# Patient Record
Sex: Male | Born: 1935 | Race: White | Hispanic: No | Marital: Married | Smoking: Never smoker
Health system: Southern US, Community
[De-identification: ages and names within clinical notes are randomized; demographics above are authoritative.]

## PROBLEM LIST (undated history)

## (undated) DIAGNOSIS — F0390 Unspecified dementia without behavioral disturbance: Secondary | ICD-10-CM

## (undated) DIAGNOSIS — E119 Type 2 diabetes mellitus without complications: Secondary | ICD-10-CM

## (undated) HISTORY — DX: Type 2 diabetes mellitus without complications: E11.9

## (undated) HISTORY — PX: CATARACT EXTRACTION, BILATERAL: SHX1313

---

## 1963-07-13 HISTORY — PX: TONSILLECTOMY AND ADENOIDECTOMY: SUR1326

## 2000-04-07 ENCOUNTER — Ambulatory Visit (HOSPITAL_COMMUNITY): Admission: RE | Admit: 2000-04-07 | Discharge: 2000-04-07 | Payer: Self-pay | Admitting: Gastroenterology

## 2000-04-20 ENCOUNTER — Encounter: Payer: Self-pay | Admitting: Gastroenterology

## 2000-04-20 ENCOUNTER — Encounter: Admission: RE | Admit: 2000-04-20 | Discharge: 2000-04-20 | Payer: Self-pay | Admitting: Gastroenterology

## 2001-07-31 LAB — HM COLONOSCOPY

## 2011-11-10 LAB — HM DIABETES EYE EXAM

## 2012-04-30 LAB — HM DIABETES FOOT EXAM

## 2012-05-15 ENCOUNTER — Ambulatory Visit (INDEPENDENT_AMBULATORY_CARE_PROVIDER_SITE_OTHER): Payer: Medicare Other | Admitting: Family Medicine

## 2012-05-15 ENCOUNTER — Encounter: Payer: Self-pay | Admitting: Family Medicine

## 2012-05-15 VITALS — BP 120/66 | HR 92 | Temp 98.4°F | Ht 71.0 in | Wt 172.0 lb

## 2012-05-15 DIAGNOSIS — Z7689 Persons encountering health services in other specified circumstances: Secondary | ICD-10-CM

## 2012-05-15 DIAGNOSIS — E119 Type 2 diabetes mellitus without complications: Secondary | ICD-10-CM

## 2012-05-15 DIAGNOSIS — Z7189 Other specified counseling: Secondary | ICD-10-CM

## 2012-05-15 NOTE — Patient Instructions (Signed)
-  We have ordered labs or studies at this visit. It can take up to 1-2 weeks for results and processing. We will contact you with instructions IF your results are abnormal. Normal results will be released to your MYCHART. If you have not heard from us or can not find your results in MYCHART in 2 weeks please contact our office.  -PLEASE SIGN UP FOR MYCHART TODAY   We recommend the following healthy lifestyle measures: - eat a healthy diet consisting of lots of vegetables, fruits, beans, nuts, seeds, healthy meats such as white chicken and fish and whole grains.  - avoid fried foods, fast food, processed foods, sodas, red meet and other fattening foods.  - get a least 150 minutes of aerobic exercise per week.   Follow up in: 3-4 months  

## 2012-05-15 NOTE — Progress Notes (Signed)
Chief Complaint  Patient presents with  . Establish Care    HPI: Stanley Gilbert is here to establish care. Used to see Dr. Clarene Duke, but insurance changed and can not longer see that doctor.  Has the following concerns today:  DM: -taking metformin 1000mg  bid and amaryl 8mg  bid - reports has been taking this for at least 10 years and feels fine with this dose and doesn't want to change this, renal function has always been fine -foot exam 6 months ago -regular eye exams - last one about 2 weeks -walks 2 miles per day and eats healthly  Had labs checked on 04/23/2012 -HgbA1c 7.2, fasting BS ave 101, high 145, low 79 -LDL 93, HDL 30 -microalb 3.3 -CMP and CBC  Normal about 1 year ago  Had flu vaccine 04/23/12, pneumovax 3 years, refuses shingles vaccine  ROS: See pertinent positives and negatives per HPI.  Past Medical History  Diagnosis Date  . Diabetes     Family History  Problem Relation Age of Onset  . Arthritis      parent  . Lung cancer      parent  . Heart disease      parent  . Diabetes      parent    History   Social History  . Marital Status: Married    Spouse Name: N/A    Number of Children: N/A  . Years of Education: N/A   Social History Main Topics  . Smoking status: Never Smoker   . Smokeless tobacco: None  . Alcohol Use: No  . Drug Use: None  . Sexually Active: None   Other Topics Concern  . None   Social History Narrative  . None    Current outpatient prescriptions:glimepiride (AMARYL) 4 MG tablet, Take 2 tablets by mouth twice a day., Disp: , Rfl: ;  glucose blood (ONE TOUCH ULTRA TEST) test strip, 1 each by Other route as needed. Use as instructed, Disp: , Rfl: ;  Lancets (ONETOUCH ULTRASOFT) lancets, 1 each by Other route as needed. Use as instructed, Disp: , Rfl: ;  metFORMIN (GLUCOPHAGE) 500 MG tablet, Take 1,000 mg by mouth 2 (two) times daily with a meal. , Disp: , Rfl:  Multiple Vitamin (MULTIVITAMIN) tablet, Take 1 tablet by mouth  daily., Disp: , Rfl:   EXAM:  Filed Vitals:   05/15/12 1255  BP: 120/66  Pulse: 92  Temp: 98.4 F (36.9 C)    Body mass index is 23.99 kg/(m^2).  GENERAL: vitals reviewed and listed above, alert, oriented, appears well hydrated and in no acute distress  HEENT: atraumatic, conjunttiva clear, no obvious abnormalities on inspection of external nose and ears  NECK: no obvious masses on inspection  LUNGS: clear to auscultation bilaterally, no wheezes, rales or rhonchi, good air movement  CV: HRRR, no peripheral edema  MS: moves all extremities without noticeable abnormality  PSYCH: pleasant and cooperative, no obvious depression or anxiety  ASSESSMENT AND PLAN:  Discussed the following assessment and plan:  1. Diabetes  -exercise is great, diet has room for improvement - disucssed -home blood sugars are great -on greater then max dose of amaryl - uncertain if this is needed -pt will decrease to 4 mg bid of amaryl, work on diet, follow up in 3 months with home log - will return sooner if increase in home BSs  2. Encounter to establish care with new doctor    -We reviewed the PMH, PSH, FH, SH, Meds and Allergies. -We  provided refills for any medications we will prescribe as needed. -We addressed current concerns per orders and patient instructions. -We have asked for records for pertinent exams, studies, vaccines and notes from previous providers. -We have advised patient to follow up per instructions below. -Influenza vaccine given today  -Patient advised to return or notify a doctor immediately if symptoms worsen or persist or new concerns arise.  Patient Instructions  -We have ordered labs or studies at this visit. It can take up to 1-2 weeks for results and processing. We will contact you with instructions IF your results are abnormal. Normal results will be released to your [facility]. If you have not heard from [place] or can not find your results in [facility] in 2 weeks please  contact our office.  -PLEASE SIGN UP FOR MYCHART TODAY   We recommend the following healthy lifestyle measures: - eat a healthy diet consisting of lots of vegetables, fruits, beans, nuts, seeds, healthy meats such as white chicken and fish and whole grains.  - avoid fried foods, fast food, processed foods, sodas, red meet and other fattening foods.  - get a least 150 minutes of aerobic exercise per week.   Follow up in: 3-4 months      Brenten Janney R.

## 2012-09-12 ENCOUNTER — Ambulatory Visit: Payer: Medicare Other | Admitting: Family Medicine

## 2012-10-10 ENCOUNTER — Ambulatory Visit: Payer: Medicare Other | Admitting: Family Medicine

## 2012-10-23 DIAGNOSIS — L409 Psoriasis, unspecified: Secondary | ICD-10-CM | POA: Insufficient documentation

## 2013-07-27 DIAGNOSIS — E11649 Type 2 diabetes mellitus with hypoglycemia without coma: Secondary | ICD-10-CM | POA: Insufficient documentation

## 2013-07-27 DIAGNOSIS — E119 Type 2 diabetes mellitus without complications: Secondary | ICD-10-CM | POA: Insufficient documentation

## 2015-08-20 DIAGNOSIS — L570 Actinic keratosis: Secondary | ICD-10-CM | POA: Diagnosis not present

## 2015-08-20 DIAGNOSIS — R351 Nocturia: Secondary | ICD-10-CM | POA: Diagnosis not present

## 2015-08-20 DIAGNOSIS — E782 Mixed hyperlipidemia: Secondary | ICD-10-CM | POA: Diagnosis not present

## 2015-08-20 DIAGNOSIS — Z125 Encounter for screening for malignant neoplasm of prostate: Secondary | ICD-10-CM | POA: Diagnosis not present

## 2015-08-20 DIAGNOSIS — E139 Other specified diabetes mellitus without complications: Secondary | ICD-10-CM | POA: Diagnosis not present

## 2015-11-07 DIAGNOSIS — E119 Type 2 diabetes mellitus without complications: Secondary | ICD-10-CM | POA: Diagnosis not present

## 2015-12-04 DIAGNOSIS — E119 Type 2 diabetes mellitus without complications: Secondary | ICD-10-CM | POA: Diagnosis not present

## 2015-12-09 DIAGNOSIS — H53001 Unspecified amblyopia, right eye: Secondary | ICD-10-CM | POA: Diagnosis not present

## 2015-12-09 DIAGNOSIS — E119 Type 2 diabetes mellitus without complications: Secondary | ICD-10-CM | POA: Diagnosis not present

## 2015-12-09 DIAGNOSIS — H26493 Other secondary cataract, bilateral: Secondary | ICD-10-CM | POA: Diagnosis not present

## 2015-12-09 DIAGNOSIS — H524 Presbyopia: Secondary | ICD-10-CM | POA: Diagnosis not present

## 2015-12-11 DIAGNOSIS — E119 Type 2 diabetes mellitus without complications: Secondary | ICD-10-CM | POA: Diagnosis not present

## 2016-04-05 DIAGNOSIS — Z23 Encounter for immunization: Secondary | ICD-10-CM | POA: Diagnosis not present

## 2016-04-05 DIAGNOSIS — L570 Actinic keratosis: Secondary | ICD-10-CM | POA: Diagnosis not present

## 2016-04-05 DIAGNOSIS — E139 Other specified diabetes mellitus without complications: Secondary | ICD-10-CM | POA: Diagnosis not present

## 2016-04-06 DIAGNOSIS — E139 Other specified diabetes mellitus without complications: Secondary | ICD-10-CM | POA: Diagnosis not present

## 2016-06-12 DIAGNOSIS — E119 Type 2 diabetes mellitus without complications: Secondary | ICD-10-CM | POA: Diagnosis not present

## 2016-07-30 DIAGNOSIS — M79641 Pain in right hand: Secondary | ICD-10-CM | POA: Diagnosis not present

## 2016-07-30 DIAGNOSIS — W19XXXA Unspecified fall, initial encounter: Secondary | ICD-10-CM | POA: Diagnosis not present

## 2016-07-30 DIAGNOSIS — R109 Unspecified abdominal pain: Secondary | ICD-10-CM | POA: Diagnosis not present

## 2016-08-03 DIAGNOSIS — H524 Presbyopia: Secondary | ICD-10-CM | POA: Diagnosis not present

## 2016-08-03 DIAGNOSIS — H5212 Myopia, left eye: Secondary | ICD-10-CM | POA: Diagnosis not present

## 2016-08-03 DIAGNOSIS — H5201 Hypermetropia, right eye: Secondary | ICD-10-CM | POA: Diagnosis not present

## 2016-08-03 DIAGNOSIS — H26493 Other secondary cataract, bilateral: Secondary | ICD-10-CM | POA: Diagnosis not present

## 2016-08-03 DIAGNOSIS — E119 Type 2 diabetes mellitus without complications: Secondary | ICD-10-CM | POA: Diagnosis not present

## 2016-08-03 DIAGNOSIS — H52221 Regular astigmatism, right eye: Secondary | ICD-10-CM | POA: Diagnosis not present

## 2016-09-05 DIAGNOSIS — E119 Type 2 diabetes mellitus without complications: Secondary | ICD-10-CM | POA: Diagnosis not present

## 2016-10-04 DIAGNOSIS — J309 Allergic rhinitis, unspecified: Secondary | ICD-10-CM | POA: Diagnosis not present

## 2016-10-04 DIAGNOSIS — E139 Other specified diabetes mellitus without complications: Secondary | ICD-10-CM | POA: Diagnosis not present

## 2016-10-04 DIAGNOSIS — L570 Actinic keratosis: Secondary | ICD-10-CM | POA: Diagnosis not present

## 2016-10-04 DIAGNOSIS — R42 Dizziness and giddiness: Secondary | ICD-10-CM | POA: Diagnosis not present

## 2016-12-03 DIAGNOSIS — E119 Type 2 diabetes mellitus without complications: Secondary | ICD-10-CM | POA: Diagnosis not present

## 2017-01-25 DIAGNOSIS — H01005 Unspecified blepharitis left lower eyelid: Secondary | ICD-10-CM | POA: Diagnosis not present

## 2017-01-25 DIAGNOSIS — H01001 Unspecified blepharitis right upper eyelid: Secondary | ICD-10-CM | POA: Diagnosis not present

## 2017-01-25 DIAGNOSIS — H01002 Unspecified blepharitis right lower eyelid: Secondary | ICD-10-CM | POA: Diagnosis not present

## 2017-01-25 DIAGNOSIS — H01004 Unspecified blepharitis left upper eyelid: Secondary | ICD-10-CM | POA: Diagnosis not present

## 2017-02-08 DIAGNOSIS — H01005 Unspecified blepharitis left lower eyelid: Secondary | ICD-10-CM | POA: Diagnosis not present

## 2017-02-08 DIAGNOSIS — H01001 Unspecified blepharitis right upper eyelid: Secondary | ICD-10-CM | POA: Diagnosis not present

## 2017-02-08 DIAGNOSIS — H01004 Unspecified blepharitis left upper eyelid: Secondary | ICD-10-CM | POA: Diagnosis not present

## 2017-02-08 DIAGNOSIS — H01002 Unspecified blepharitis right lower eyelid: Secondary | ICD-10-CM | POA: Diagnosis not present

## 2017-03-07 DIAGNOSIS — E1165 Type 2 diabetes mellitus with hyperglycemia: Secondary | ICD-10-CM | POA: Diagnosis not present

## 2017-03-08 DIAGNOSIS — E119 Type 2 diabetes mellitus without complications: Secondary | ICD-10-CM | POA: Diagnosis not present

## 2017-04-12 DIAGNOSIS — Z23 Encounter for immunization: Secondary | ICD-10-CM | POA: Diagnosis not present

## 2017-04-12 DIAGNOSIS — R2681 Unsteadiness on feet: Secondary | ICD-10-CM | POA: Diagnosis not present

## 2017-04-12 DIAGNOSIS — E1165 Type 2 diabetes mellitus with hyperglycemia: Secondary | ICD-10-CM | POA: Diagnosis not present

## 2017-04-19 ENCOUNTER — Ambulatory Visit: Payer: Medicare Other | Admitting: Physical Therapy

## 2017-06-21 DIAGNOSIS — E119 Type 2 diabetes mellitus without complications: Secondary | ICD-10-CM | POA: Diagnosis not present

## 2017-06-21 DIAGNOSIS — E1165 Type 2 diabetes mellitus with hyperglycemia: Secondary | ICD-10-CM | POA: Diagnosis not present

## 2017-08-11 DIAGNOSIS — H26491 Other secondary cataract, right eye: Secondary | ICD-10-CM | POA: Diagnosis not present

## 2017-08-11 DIAGNOSIS — E119 Type 2 diabetes mellitus without complications: Secondary | ICD-10-CM | POA: Diagnosis not present

## 2017-08-11 DIAGNOSIS — H1859 Other hereditary corneal dystrophies: Secondary | ICD-10-CM | POA: Diagnosis not present

## 2017-08-11 DIAGNOSIS — H04121 Dry eye syndrome of right lacrimal gland: Secondary | ICD-10-CM | POA: Diagnosis not present

## 2017-09-01 DIAGNOSIS — H26492 Other secondary cataract, left eye: Secondary | ICD-10-CM | POA: Diagnosis not present

## 2017-09-15 DIAGNOSIS — E119 Type 2 diabetes mellitus without complications: Secondary | ICD-10-CM | POA: Diagnosis not present

## 2017-09-30 DIAGNOSIS — Z7984 Long term (current) use of oral hypoglycemic drugs: Secondary | ICD-10-CM | POA: Diagnosis not present

## 2017-09-30 DIAGNOSIS — L570 Actinic keratosis: Secondary | ICD-10-CM | POA: Diagnosis not present

## 2017-09-30 DIAGNOSIS — E1165 Type 2 diabetes mellitus with hyperglycemia: Secondary | ICD-10-CM | POA: Diagnosis not present

## 2017-10-24 DIAGNOSIS — M79674 Pain in right toe(s): Secondary | ICD-10-CM | POA: Diagnosis not present

## 2017-10-24 DIAGNOSIS — B351 Tinea unguium: Secondary | ICD-10-CM | POA: Diagnosis not present

## 2017-11-02 DIAGNOSIS — G259 Extrapyramidal and movement disorder, unspecified: Secondary | ICD-10-CM | POA: Diagnosis not present

## 2017-11-02 DIAGNOSIS — L989 Disorder of the skin and subcutaneous tissue, unspecified: Secondary | ICD-10-CM | POA: Diagnosis not present

## 2017-11-02 DIAGNOSIS — Z23 Encounter for immunization: Secondary | ICD-10-CM | POA: Diagnosis not present

## 2017-11-02 DIAGNOSIS — Z0000 Encounter for general adult medical examination without abnormal findings: Secondary | ICD-10-CM | POA: Diagnosis not present

## 2017-11-07 DIAGNOSIS — E139 Other specified diabetes mellitus without complications: Secondary | ICD-10-CM | POA: Diagnosis not present

## 2017-11-07 DIAGNOSIS — H1013 Acute atopic conjunctivitis, bilateral: Secondary | ICD-10-CM | POA: Diagnosis not present

## 2017-11-08 ENCOUNTER — Ambulatory Visit: Payer: Medicare Other | Admitting: Podiatry

## 2017-11-08 ENCOUNTER — Encounter: Payer: Self-pay | Admitting: Podiatry

## 2017-11-08 DIAGNOSIS — M79675 Pain in left toe(s): Secondary | ICD-10-CM | POA: Diagnosis not present

## 2017-11-08 DIAGNOSIS — M79674 Pain in right toe(s): Secondary | ICD-10-CM

## 2017-11-08 DIAGNOSIS — L600 Ingrowing nail: Secondary | ICD-10-CM | POA: Diagnosis not present

## 2017-11-08 DIAGNOSIS — B351 Tinea unguium: Secondary | ICD-10-CM | POA: Diagnosis not present

## 2017-11-08 NOTE — Progress Notes (Signed)
Subjective:    Patient ID: Stanley Gilbert, male    DOB: October 22, 1930, 84 y.o.   MRN: [id number]  HPI 84 year old male presents after his wife for concerns of thick, discolored, elongated toenails which he cannot trim himself due to the thickness.  Also states that he had some redness around the right big toenail for the last 1 to 2 months he was previously on Keflex which did resolve the redness and the tenderness is much improved.  Denies any drainage or pus coming from the toenail sites today.  His wife tries to trim the toenails but she cannot do them to do the thickness.  He has no other concerns.   Review of Systems  All other systems reviewed and are negative.  Past Medical History:  Diagnosis Date  . Diabetes [facility])     Past Surgical History:  Procedure Laterality Date  . TONSILLECTOMY AND ADENOIDECTOMY  1965     Current Outpatient Medications:  .  glimepiride (AMARYL) 4 MG tablet, Take 2 tablets by mouth twice a day., Disp: , Rfl:  .  glucose blood (ONE TOUCH ULTRA TEST) test strip, 1 each by Other route as needed. Use as instructed, Disp: , Rfl:  .  Lancets (ONETOUCH ULTRASOFT) lancets, 1 each by Other route as needed. Use as instructed, Disp: , Rfl:  .  metFORMIN (GLUCOPHAGE) 500 MG tablet, Take 1,000 mg by mouth 2 (two) times daily with a meal. , Disp: , Rfl:  .  Multiple Vitamin (MULTIVITAMIN) tablet, Take 1 tablet by mouth daily., Disp: , Rfl:   Allergies  Allergen Reactions  . Sitagliptin     Other reaction(s): ABDOMINAL PAIN    Social History   Socioeconomic History  . Marital status: Married    Spouse name: Not on file  . Number of children: Not on file  . Years of education: Not on file  . Highest education level: Not on file  Occupational History  . Not on file  Social Needs  . Financial resource strain: Not on file  . Food insecurity:    Worry: Not on file    Inability: Not on file  . Transportation needs:    Medical: Not on file    Non-medical:  Not on file  Tobacco Use  . Smoking status: Never Smoker  . Smokeless tobacco: Never Used  Substance and Sexual Activity  . Alcohol use: No  . Drug use: Not on file  . Sexual activity: Not on file  Lifestyle  . Physical activity:    Days per week: Not on file    Minutes per session: Not on file  . Stress: Not on file  Relationships  . Social connections:    Talks on phone: Not on file    Gets together: Not on file    Attends religious service: Not on file    Active member of club or organization: Not on file    Attends meetings of clubs or organizations: Not on file    Relationship status: Not on file  . Intimate partner violence:    Fear of current or ex partner: Not on file    Emotionally abused: Not on file    Physically abused: Not on file    Forced sexual activity: Not on file  Other Topics Concern  . Not on file  Social History Narrative  . Not on file         Objective:   Physical Exam General: AAO x3, NAD  Dermatological:  Nails are hypertrophic, dystrophic, brittle, discolored, elongated 10.  There is incurvation present on the right hallux toenails both medial lateral nail corners.  The tenderness is only to the distal portion of nails is elongated but there is no other area tenderness identified.  No surrounding redness or drainage. Tenderness nails 1-5 bilaterally. No open lesions or pre-ulcerative lesions are identified today.  Vascular: DP pulses 2/4, PT pulses 1/4, CRT less than 3 seconds.  Neruologic: Grossly intact via light touch bilateral.  Protective threshold with Semmes Wienstein monofilament intact to all pedal sites bilateral.   Musculoskeletal: No gross boney pedal deformities bilateral. No pain, crepitus, or limitation noted with foot and ankle range of motion bilateral. Muscular strength 5/5 in all groups tested bilateral.     Assessment & Plan:  84 year old male with symptomatic onychomycosis, right hallux ingrown toenail currently without  signs of infection -Treatment options discussed including all alternatives, risks, and complications -Etiology of symptoms were discussed -Discussed partial nail avulsion right hallux toenail however is no signs of infection there is only minimal pain this is only to the distal portion of the toe to the first decided to proceed with debridement. After debridement there was resolution of symptoms.  -Nails debrided 10 without complications or bleeding. -Daily foot inspection -Follow-up in 3 months or sooner if any problems arise. In the meantime, encouraged to call the office with any questions, concerns, change in symptoms.   Celesta Gentile, DPM

## 2017-12-12 ENCOUNTER — Ambulatory Visit: Payer: Medicare Other | Admitting: Neurology

## 2018-01-09 ENCOUNTER — Telehealth: Payer: Self-pay | Admitting: Podiatry

## 2018-01-09 NOTE — Telephone Encounter (Signed)
This is [name], Therapist, sports with Dr. Dahlia Bailiff office. I spoke with someone who confirmed the pt was seen on 30 April but we have not received those notes. I need those faxed to me as the pt is here now. My direct fax number is [phone]. If you have questions, feel free to call me at [phone]. Thank you so much. Bye bye.

## 2018-02-06 DIAGNOSIS — E139 Other specified diabetes mellitus without complications: Secondary | ICD-10-CM | POA: Diagnosis not present

## 2018-02-24 DIAGNOSIS — L821 Other seborrheic keratosis: Secondary | ICD-10-CM | POA: Diagnosis not present

## 2018-02-24 DIAGNOSIS — L570 Actinic keratosis: Secondary | ICD-10-CM | POA: Diagnosis not present

## 2018-02-24 DIAGNOSIS — C44311 Basal cell carcinoma of skin of nose: Secondary | ICD-10-CM | POA: Diagnosis not present

## 2018-02-24 DIAGNOSIS — D485 Neoplasm of uncertain behavior of skin: Secondary | ICD-10-CM | POA: Diagnosis not present

## 2018-02-24 DIAGNOSIS — C44329 Squamous cell carcinoma of skin of other parts of face: Secondary | ICD-10-CM | POA: Diagnosis not present

## 2018-03-02 DIAGNOSIS — E119 Type 2 diabetes mellitus without complications: Secondary | ICD-10-CM | POA: Diagnosis not present

## 2018-03-02 DIAGNOSIS — H53001 Unspecified amblyopia, right eye: Secondary | ICD-10-CM | POA: Diagnosis not present

## 2018-03-02 DIAGNOSIS — H04121 Dry eye syndrome of right lacrimal gland: Secondary | ICD-10-CM | POA: Diagnosis not present

## 2018-03-02 DIAGNOSIS — H1859 Other hereditary corneal dystrophies: Secondary | ICD-10-CM | POA: Diagnosis not present

## 2018-03-03 DIAGNOSIS — C4492 Squamous cell carcinoma of skin, unspecified: Secondary | ICD-10-CM | POA: Diagnosis not present

## 2018-03-03 DIAGNOSIS — C4491 Basal cell carcinoma of skin, unspecified: Secondary | ICD-10-CM | POA: Diagnosis not present

## 2018-03-07 DIAGNOSIS — H1850 Unspecified hereditary corneal dystrophies: Secondary | ICD-10-CM | POA: Diagnosis not present

## 2018-03-07 DIAGNOSIS — H1852 Epithelial (juvenile) corneal dystrophy: Secondary | ICD-10-CM | POA: Diagnosis not present

## 2018-03-07 DIAGNOSIS — H16403 Unspecified corneal neovascularization, bilateral: Secondary | ICD-10-CM | POA: Diagnosis not present

## 2018-03-07 DIAGNOSIS — H52213 Irregular astigmatism, bilateral: Secondary | ICD-10-CM | POA: Diagnosis not present

## 2018-05-09 DIAGNOSIS — E139 Other specified diabetes mellitus without complications: Secondary | ICD-10-CM | POA: Diagnosis not present

## 2018-05-09 DIAGNOSIS — E1169 Type 2 diabetes mellitus with other specified complication: Secondary | ICD-10-CM | POA: Diagnosis not present

## 2018-08-03 DIAGNOSIS — E119 Type 2 diabetes mellitus without complications: Secondary | ICD-10-CM | POA: Diagnosis not present

## 2018-08-03 DIAGNOSIS — Z961 Presence of intraocular lens: Secondary | ICD-10-CM | POA: Diagnosis not present

## 2018-08-03 DIAGNOSIS — H04121 Dry eye syndrome of right lacrimal gland: Secondary | ICD-10-CM | POA: Diagnosis not present

## 2018-08-03 DIAGNOSIS — E139 Other specified diabetes mellitus without complications: Secondary | ICD-10-CM | POA: Diagnosis not present

## 2018-08-03 DIAGNOSIS — H1859 Other hereditary corneal dystrophies: Secondary | ICD-10-CM | POA: Diagnosis not present

## 2018-08-21 ENCOUNTER — Encounter: Payer: Self-pay | Admitting: Podiatrist

## 2018-08-21 ENCOUNTER — Ambulatory Visit (INDEPENDENT_AMBULATORY_CARE_PROVIDER_SITE_OTHER): Payer: Medicare Other | Admitting: Podiatrist

## 2018-08-21 VITALS — BP 149/77 | HR 75

## 2018-08-21 DIAGNOSIS — L600 Ingrowing nail: Secondary | ICD-10-CM

## 2018-08-21 NOTE — Patient Instructions (Signed)
Soak Instructions    THE DAY AFTER THE PROCEDURE  Place 1/4 cup of epsom salts in a quart of warm tap water.  Submerge your foot or feet with outer bandage intact for the initial soak; this will allow the bandage to become moist and wet for easy lift off.  Once you remove your bandage, continue to soak in the solution for 20 minutes.  This soak should be done twice a day.  Next, remove your foot or feet from solution, blot dry the affected area and cover.  You may use a band aid large enough to cover the area or use gauze and tape.  Apply other medications to the area as directed by the doctor such as polysporin neosporin.  IF YOUR SKIN BECOMES IRRITATED WHILE USING THESE INSTRUCTIONS, IT IS OKAY TO SWITCH TO  WHITE VINEGAR AND WATER. Or you may use antibacterial soap and water to keep the toe clean    

## 2018-08-21 NOTE — Progress Notes (Signed)
  Chief Complaint  Patient presents with  . Nail Problem    Right 1st toenail medial side ingrown, painful for 1wk, pt states nail fungus caused it.     HPI: Patient is 85 y.o. male who presents today for painful ingrown hallux nail right foot.  The patients wife states she usually cuts his nails and was unable to trim out the corner- it has been painful for some time now.  He has fungus present and is concerned the nail is ingrown due to the fungus.    Allergies  Allergen Reactions  . Sitagliptin     Other reaction(s): ABDOMINAL PAIN    Review of systems is reviewed and negative.   Physical Exam  Patient is awake, alert, and oriented x 3.  In no acute distress.    Vascular status is intact with palpable pedal pulses DP and PT bilateral and capillary refill time within normal limits.  Neurological sensation is also intact bilaterally via Semmes Weinstein monofilament at 5/5 sites. Light touch, vibratory sensation, Achilles tendon reflex is intact.  Dermatological exam reveals skin color and texture as normal. No open lesions present. Nails are yellow, mycotic, thickened with subungual debris present. Right medial hallux is ingrowing into the skin. No pus, or drainage noted-  No redness noted.   Musculoskeletal exam: Musculature intact with dorsiflexion, plantarflexion, inversion, eversion. Ankle and First MPJ joint range of motion normal.     Assessment: Ingrown right medial border of great toenail  Plan: Treatment options and alternatives were discussed. Recommended a permanent removal of the medial nail border of the right hallux nail. Patient agreed. Skin was prepped with alcohol and a local injection of lidocaine and Marcaine plain was infiltrated to anesthetize the toe. The toe was then prepped with Betadine exsanguinated. The offending medial nail border was removed and phenol applied.  It was cleansed well with alcohol. Antibiotic ointment and a dressing was then applied and  the patient was given instructions for aftercare. She will be seen for a nail check in 1 week. And will call if any questions or concerns arise.  Marland Kitchen

## 2018-08-28 ENCOUNTER — Ambulatory Visit (INDEPENDENT_AMBULATORY_CARE_PROVIDER_SITE_OTHER): Payer: Self-pay | Admitting: Podiatrist

## 2018-08-28 DIAGNOSIS — L600 Ingrowing nail: Secondary | ICD-10-CM

## 2018-08-29 ENCOUNTER — Encounter: Payer: Self-pay | Admitting: Podiatrist

## 2018-08-29 NOTE — Progress Notes (Signed)
Chief Complaint  Patient presents with  . Nail Problem    Right 1st nail medial side check. Pt states healing well with no concerns. Pt denies fever/nausea/vomiting/chills.     HPI: Patient is 85 y.o. male who presents today for nail check right hallux medial side-  Relates he had some pain 24 hours after the procedure which has gradually subsided.  Denies nausea, vomiting, fevers, chills.     Allergies  Allergen Reactions  . Sitagliptin     Other reaction(s): ABDOMINAL PAIN    Review of systems is reviewed and negative.   Physical Exam  Patient is awake, alert, and oriented x 3.  In no acute distress.    Vascular status is intact with palpable pedal pulses DP and PT bilateral and capillary refill time less than 3 seconds bilateral.  No edema or erythema noted.  Neurological exam reveals epicritic and protective sensation grossly intact bilateral.   Right hallus medial nail border is healng well-  No drainage noted.  Minimal redness from the procedure noted-  No concren of infection present   Assessment: S/p ap nail medial side of first  Plan: Healing well s/p ap nail procedure . He will continue to dress the toe until it is no longer sore.  He will call if any problems or questions arise.

## 2019-02-09 ENCOUNTER — Encounter: Payer: Self-pay | Admitting: Neurology

## 2019-02-09 DIAGNOSIS — E1169 Type 2 diabetes mellitus with other specified complication: Secondary | ICD-10-CM | POA: Diagnosis not present

## 2019-02-09 DIAGNOSIS — L570 Actinic keratosis: Secondary | ICD-10-CM | POA: Diagnosis not present

## 2019-02-09 DIAGNOSIS — Z0000 Encounter for general adult medical examination without abnormal findings: Secondary | ICD-10-CM | POA: Diagnosis not present

## 2019-02-09 DIAGNOSIS — E782 Mixed hyperlipidemia: Secondary | ICD-10-CM | POA: Diagnosis not present

## 2019-02-14 ENCOUNTER — Encounter: Payer: Self-pay | Admitting: Neurology

## 2019-03-08 DIAGNOSIS — W19XXXA Unspecified fall, initial encounter: Secondary | ICD-10-CM | POA: Diagnosis not present

## 2019-03-08 DIAGNOSIS — S0990XA Unspecified injury of head, initial encounter: Secondary | ICD-10-CM | POA: Diagnosis not present

## 2019-03-09 DIAGNOSIS — S0093XA Contusion of unspecified part of head, initial encounter: Secondary | ICD-10-CM | POA: Diagnosis not present

## 2019-03-09 DIAGNOSIS — S61411A Laceration without foreign body of right hand, initial encounter: Secondary | ICD-10-CM | POA: Diagnosis not present

## 2019-03-15 DIAGNOSIS — H04121 Dry eye syndrome of right lacrimal gland: Secondary | ICD-10-CM | POA: Diagnosis not present

## 2019-03-15 DIAGNOSIS — E119 Type 2 diabetes mellitus without complications: Secondary | ICD-10-CM | POA: Diagnosis not present

## 2019-03-15 DIAGNOSIS — Z961 Presence of intraocular lens: Secondary | ICD-10-CM | POA: Diagnosis not present

## 2019-03-15 DIAGNOSIS — H53001 Unspecified amblyopia, right eye: Secondary | ICD-10-CM | POA: Diagnosis not present

## 2019-03-15 NOTE — Progress Notes (Addendum)
Stanley Gilbert was seen today in the movement disorders clinic for neurologic consultation at the request of [name], MD.  The consultation is for the evaluation of parkinsonism and memory change.  This patient is accompanied in the office by his spouse who supplements the history.  Wife states that it started with a fall 2 years ago.  He fell over a trash can and hit his head and broke his glasses.   He was evaluated at the [place] walk in clinic.  Wife states that he started taking "baby steps" after that "because he was afraid that he would fall."  2 weeks ago, he fell again on the sidewalk.  He didn't hit his head but did break his glasses.  His hand broke the fall.     Specific Symptoms:  Tremor: Yes.   , "once in a while" per wife and may be right or left and not at rest.  Mostly with activation - "reaching for a bottle" Family hx of similar:  No. Voice: getting softer Sleep:   Vivid Dreams:  No.  Acting out dreams:  Yes.   - just some sleep talking per wife Wet Pillows: No. Postural symptoms:  Yes.    Falls?  Yes.   Bradykinesia symptoms: shuffling/slow movements; trouble getting out of the chair Loss of smell:  No. Loss of taste:  No. Urinary Incontinence:  No. Difficulty Swallowing:  No. Handwriting, micrographia: No. - larger per patient - sometimes smaller per wife Trouble with ADL's:  No.  Trouble buttoning clothing: No. Depression:  No. Memory changes:  Yes.    X 2 years - wife thinks related to initial fall Living situation:  Pt lives with their spouse.  The patient does do the finances in the home - "we share the finances" per pt.  Sounds like they do the bills jointly.  The patient does not drive and hasn't driven for 2-3 years.  Wife was nervous to drive with him.    The patient does not cook.  Wife has always done the cooking.    ADL's:  The family sets up medications.  Wife has done pill box x 5 years.    Behavior:   There have been no behavioral changes  over the years.    Hallucinations:  No.  visual distortions: No. N/V:  No. Lightheaded:  Yes.    Syncope: No. Diplopia:  No. Dyskinesia:  No. Prior exposure to reglan/antipsychotics: No.  Neuroimaging of the brain has not previously been performed.    PREVIOUS MEDICATIONS: none to date  ALLERGIES:   Allergies  Allergen Reactions  . Sitagliptin     Other reaction(s): ABDOMINAL PAIN    CURRENT MEDICATIONS:  Current Outpatient Medications  Medication Instructions  . donepezil (ARICEPT) 5 mg, Oral, Daily at bedtime  . fluocinonide cream (LIDEX) 0.05 % Apply to affected area 3 times a week as needed.  Marland Kitchen glimepiride (AMARYL) 4 MG tablet Take 2 tablets by mouth twice a day.  Marland Kitchen glucose blood (ONE TOUCH ULTRA TEST) test strip 1 each, As needed  . Lancets (ONETOUCH ULTRASOFT) lancets 1 each, As needed  . metFORMIN (GLUCOPHAGE) 1,000 mg, 2 times daily with meals  . Multiple Vitamin (MULTIVITAMIN) tablet 1 tablet, Daily  . pioglitazone (ACTOS) 15 MG tablet TAKE 1 TABLET BY MOUTH ONCE DAILY FOR 90 DAYS  . pravastatin (PRAVACHOL) 20 MG tablet TAKE 1 TABLET BY MOUTH ONCE A WEEK FOR 90 DAYS    PAST MEDICAL HISTORY:  Past Medical History:  Diagnosis Date  . Diabetes ([place])     PAST SURGICAL HISTORY:   Past Surgical History:  Procedure Laterality Date  . CATARACT EXTRACTION, BILATERAL    . TONSILLECTOMY AND ADENOIDECTOMY  1965    SOCIAL HISTORY:   Social History   Socioeconomic History  . Marital status: Married    Spouse name: Not on file  . Number of children: 1  . Years of education: Not on file  . Highest education level: Some college, no degree  Occupational History  . Not on file  Social Needs  . Financial resource strain: Not on file  . Food insecurity    Worry: Not on file    Inability: Not on file  . Transportation needs    Medical: Not on file    Non-medical: Not on file  Tobacco Use  . Smoking status: Never Smoker  . Smokeless tobacco: Never Used   Substance and Sexual Activity  . Alcohol use: No  . Drug use: Not on file  . Sexual activity: Not on file  Lifestyle  . Physical activity    Days per week: Not on file    Minutes per session: Not on file  . Stress: Not on file  Relationships  . Social Herbalist on phone: Not on file    Gets together: Not on file    Attends religious service: Not on file    Active member of club or organization: Not on file    Attends meetings of clubs or organizations: Not on file    Relationship status: Not on file  . Intimate partner violence    Fear of current or ex partner: Not on file    Emotionally abused: Not on file    Physically abused: Not on file    Forced sexual activity: Not on file  Other Topics Concern  . Not on file  Social History Narrative  . Not on file    FAMILY HISTORY:   Family Status  Relation Name Status  . Other  (Not Specified)  . Other  (Not Specified)  . Other  (Not Specified)  . Other  (Not Specified)  . Mother  Deceased  . Father  Deceased  . Sister  Alive  . Brother x2 Deceased  . Sister  Deceased  . Daughter  Alive    ROS:  Review of Systems  Constitutional: Negative.   HENT: Negative.   Eyes: Negative.   Respiratory: Negative.   Cardiovascular: Negative.   Gastrointestinal: Negative.   Genitourinary: Negative.   Skin: Negative.   Endo/Heme/Allergies: Negative.   Psychiatric/Behavioral: Positive for memory loss.    PHYSICAL EXAMINATION:    VITALS:   Vitals:   03/20/19 0846  BP: 123/68  Pulse: 78  Weight: 159 lb 12.8 oz (72.5 kg)    GEN:  The patient appears stated age and is in NAD. HEENT:  Normocephalic, atraumatic.  The mucous membranes are moist. The superficial temporal arteries are without ropiness or tenderness. CV:  RRR Lungs:  CTAB Neck/HEME:  There are no carotid bruits bilaterally.  Neurological examination:  Orientation:  Montreal Cognitive Assessment Blind 03/20/2019  Attention: Read list of digits (0/2)  2  Attention: Read list of letters (0/1) 1  Attention: Serial 7 subtraction starting at 100 (0/3) 1  Language: Repeat phrase (0/2) 0  Language : Fluency (0/1) 0  Abstraction (0/2) 1  Delayed Recall (0/5) 0  Orientation (0/6) 6  Total 11  Adjusted  Score (based on education) 11    Cranial nerves: There is good facial symmetry.  Extraocular muscles are intact. The visual fields are full to confrontational testing. The speech is fluent and clear but lacks spontaneity and he lets his wife do much of the speaking. Soft palate rises symmetrically and there is no tongue deviation. Hearing is intact to conversational tone. Sensation: Sensation is intact to light and pinprick throughout (facial, trunk, extremities). Vibration is intact at the bilateral big toe. There is no extinction with double simultaneous stimulation. There is no sensory dermatomal level identified. Motor: Strength is 5/5 in the bilateral upper and lower extremities.   Shoulder shrug is equal and symmetric.  There is no pronator drift. Deep tendon reflexes: Deep tendon reflexes are 2-/4 at the bilateral biceps, triceps, brachioradialis, patella and absent at the bilateral  achilles. Plantar responses are downgoing bilaterally.  Movement examination: Tone: There is normal tone in the UE/LE Abnormal movements: there is no tremor Coordination:  There is no decremation with RAM's, with any form of RAMS, including alternating supination and pronation of the forearm, hand opening and closing, finger taps, heel taps and toe taps (although he is slow) Gait and Station: The patient has mild difficulty arising out of a deep-seated chair without the use of the hands but he is able to do it on the first attempt. The patient's stride length is short, but he is not really shuffling.  The stride length gets short of the longer he walks.  He is very slow in the turn.    Addendum Labs:  Labs are received from primary care.  Labs were dated February 09, 2019.  Sodium was 139, potassium 4.5, chloride 101, CO2 30, BUN 14, creatinine 0.85, glucose 137.  AST 12, ALT 9, alkaline phosphatase 59.  Hemoglobin A1c was 7.5.  ASSESSMENT/PLAN:  1.  Gait change, likely multifactorial  -The patient has clinical examination evidence of a diffuse peripheral neuropathy, which certainly can affect gait and balance and is likely from DM.  He reports that he has been diabetic for about 20 years.  We discussed safety associated with peripheral neuropathy.  We discussed balance therapy and the importance of ambulatory assistive device for balance assistance.  -I think it is possible that, in addition to the neuropathy, that the patient has a very mild vascular parkinsonism.  I reassured them that I saw no evidence of an idiopathic Parkinson's disease.  We discussed the differences.  We discussed that vascular parkinsonism generally does not respond to medications and that physical therapy is the only treatment.  -I did recommend physical therapy.  Wife asks if she can just do the physical therapy at the home with him, without a physical therapist.  I told her that I thought he would at least need several visits so that they could teach her what to do.  She was agreeable to that approach.  -I did recommend neuro imaging of the brain, given that the wife thinks that his balance problems started after a fall in which he hit his head.  He will have an MRI of the brain.    2.  Memory loss, likely dementia of Alzheimers type  -Neurocognitive testing was declined.  -start aricept, 5 mg daily.  Risks, benefits, side effects and alternative therapies were discussed.  The opportunity to ask questions was given and they were answered to the best of my ability.  The patient expressed understanding and willingness to follow the outlined treatment protocols.  -  We will try to get a copy of his chemistry from his primary care physician.  -We will do B12, folate, RPR.  3.  Patient's  wife does not wish to follow-up here and wishes to have primary care prescribe his medications.  If they change their mind, he can follow back up here in 6 months, sooner should new neurologic issues arise.  Otherwise, he will be discharged back to the full care of his primary care physician.  Much greater than 50% of this visit was spent in counseling and coordinating care.  Total face to face time:  60 min   Cc:  [name], MD

## 2019-03-20 ENCOUNTER — Telehealth: Payer: Self-pay

## 2019-03-20 ENCOUNTER — Ambulatory Visit: Payer: Medicare Other | Admitting: Neurology

## 2019-03-20 ENCOUNTER — Encounter: Payer: Self-pay | Admitting: Neurology

## 2019-03-20 ENCOUNTER — Other Ambulatory Visit (INDEPENDENT_AMBULATORY_CARE_PROVIDER_SITE_OTHER): Payer: Medicare Other

## 2019-03-20 ENCOUNTER — Other Ambulatory Visit: Payer: Self-pay

## 2019-03-20 ENCOUNTER — Telehealth: Payer: Self-pay | Admitting: Neurology

## 2019-03-20 VITALS — BP 123/68 | HR 78 | Wt 159.8 lb

## 2019-03-20 DIAGNOSIS — H35362 Drusen (degenerative) of macula, left eye: Secondary | ICD-10-CM | POA: Diagnosis not present

## 2019-03-20 DIAGNOSIS — R296 Repeated falls: Secondary | ICD-10-CM

## 2019-03-20 DIAGNOSIS — E1142 Type 2 diabetes mellitus with diabetic polyneuropathy: Secondary | ICD-10-CM

## 2019-03-20 DIAGNOSIS — G214 Vascular parkinsonism: Secondary | ICD-10-CM | POA: Diagnosis not present

## 2019-03-20 DIAGNOSIS — R413 Other amnesia: Secondary | ICD-10-CM

## 2019-03-20 DIAGNOSIS — R6889 Other general symptoms and signs: Secondary | ICD-10-CM

## 2019-03-20 DIAGNOSIS — S0990XD Unspecified injury of head, subsequent encounter: Secondary | ICD-10-CM

## 2019-03-20 DIAGNOSIS — H04123 Dry eye syndrome of bilateral lacrimal glands: Secondary | ICD-10-CM | POA: Diagnosis not present

## 2019-03-20 DIAGNOSIS — H35373 Puckering of macula, bilateral: Secondary | ICD-10-CM | POA: Diagnosis not present

## 2019-03-20 DIAGNOSIS — H43393 Other vitreous opacities, bilateral: Secondary | ICD-10-CM | POA: Diagnosis not present

## 2019-03-20 LAB — B12 AND FOLATE PANEL
Folate: 23 ng/mL (ref >5.9)
Vitamin B-12: 118 pg/mL — ABNORMAL LOW (ref 211–911)

## 2019-03-20 MED ORDER — DONEPEZIL HCL 5 MG PO TABS: 5.0000 mg | ORAL_TABLET | Freq: Every day | ORAL | 1 refills | Status: DC

## 2019-03-20 NOTE — Telephone Encounter (Signed)
More labs was placed on your desk inside folder was that one of them

## 2019-03-20 NOTE — Telephone Encounter (Signed)
No, that was the cholesterol only

## 2019-03-20 NOTE — Telephone Encounter (Signed)
We requested PCP labs.  I can see in notes that they ordered CMP.  They only sent cholesterol testing.  Can you ask for labs from last year.

## 2019-03-20 NOTE — Telephone Encounter (Signed)
Spoke with spouse she was aware of results and recommend PCP given injections. Copy of labs printed Will fax with cover sheet requesting that B-12 injections be given in their office.

## 2019-03-20 NOTE — Patient Instructions (Addendum)
~   A referral to [facility] has been placed for your MRI someone will contact you directly to schedule your appt. They are located at [address]. Please contact them directly by calling [phone] with any questions regarding your referral.    ~ Your provider has requested that you have labwork completed today. Please go to [facility] ([id number]) on the second floor of this building before leaving the office today. You do not need to check in. If you are not called within 15 minutes please check with the front desk.    ~ A referral has been place for Physical Therapy with [facility] someone should be calling you within the next 2-3 days to schedule home visit. If you have not heard from them please contact them at [phone]

## 2019-03-20 NOTE — Telephone Encounter (Signed)
On desk

## 2019-03-20 NOTE — Telephone Encounter (Signed)
-----   Message from [place], DO sent at 03/20/2019 12:49 PM EDT ----- Please let pt/wife know that pts b12 level was pretty low.  I would generally recommend injections for this level (weekly for a month, followed by monthly) but I know that wife didn't want to come back here.  I suspect that perhaps PCP could give these if agreeable.  If they don't want to come back here, fax results to PCP with note that we recommended injections and pt wanted them done at their office.

## 2019-03-20 NOTE — Telephone Encounter (Signed)
[phone]  Fax#

## 2019-03-21 LAB — RPR TITER: RPR Titer: 1:1 {titer} — ABNORMAL HIGH

## 2019-03-21 LAB — RPR: RPR Ser Ql: REACTIVE — AB

## 2019-03-21 LAB — FLUORESCENT TREPONEMAL AB(FTA)-IGG-BLD: Fluorescent Treponemal ABS: NONREACTIVE

## 2019-03-22 ENCOUNTER — Telehealth: Payer: Self-pay | Admitting: Neurology

## 2019-03-22 NOTE — Telephone Encounter (Signed)
Called no answer left message with verbal order approval  [facility]

## 2019-03-22 NOTE — Telephone Encounter (Signed)
Left message with the after hour service on 03-22-19 @ 12:28 pm    Stanley Gilbert needs to speak with someone about patient  frequency of 1 time a week for 4 weeks for safety gate and balance training  Request a call back and can leave a VM

## 2019-04-17 ENCOUNTER — Other Ambulatory Visit: Payer: Medicare Other

## 2019-04-26 DIAGNOSIS — M542 Cervicalgia: Secondary | ICD-10-CM | POA: Diagnosis not present

## 2019-08-27 ENCOUNTER — Other Ambulatory Visit (HOSPITAL_COMMUNITY): Payer: Self-pay | Admitting: Family Medicine

## 2019-08-27 ENCOUNTER — Other Ambulatory Visit: Payer: Self-pay | Admitting: Family Medicine

## 2019-08-27 DIAGNOSIS — F0280 Dementia in other diseases classified elsewhere without behavioral disturbance: Secondary | ICD-10-CM

## 2019-08-27 DIAGNOSIS — G301 Alzheimer's disease with late onset: Secondary | ICD-10-CM

## 2019-09-03 ENCOUNTER — Other Ambulatory Visit: Payer: Self-pay

## 2019-09-03 ENCOUNTER — Ambulatory Visit (HOSPITAL_COMMUNITY)
Admission: RE | Admit: 2019-09-03 | Discharge: 2019-09-03 | Disposition: A | Discharge status: Home / Self Care | Payer: Medicare Other | Source: Ambulatory Visit | Attending: Family Medicine | Admitting: Family Medicine

## 2019-09-03 DIAGNOSIS — F0280 Dementia in other diseases classified elsewhere without behavioral disturbance: Secondary | ICD-10-CM | POA: Insufficient documentation

## 2019-09-03 DIAGNOSIS — G301 Alzheimer's disease with late onset: Secondary | ICD-10-CM | POA: Diagnosis present

## 2020-03-10 ENCOUNTER — Ambulatory Visit (INDEPENDENT_AMBULATORY_CARE_PROVIDER_SITE_OTHER): Payer: Medicare Other | Admitting: Podiatry

## 2020-03-10 ENCOUNTER — Other Ambulatory Visit: Payer: Self-pay

## 2020-03-10 DIAGNOSIS — E1169 Type 2 diabetes mellitus with other specified complication: Secondary | ICD-10-CM

## 2020-03-10 DIAGNOSIS — Z794 Long term (current) use of insulin: Secondary | ICD-10-CM | POA: Diagnosis not present

## 2020-03-10 DIAGNOSIS — M79675 Pain in left toe(s): Secondary | ICD-10-CM

## 2020-03-10 DIAGNOSIS — B351 Tinea unguium: Secondary | ICD-10-CM | POA: Diagnosis not present

## 2020-03-10 NOTE — Progress Notes (Signed)
  Subjective:  Patient ID: Stanley Gilbert, male    DOB: [birth year],  MRN: [id number]  Chief Complaint  Patient presents with  . Nail Problem    thick painful toenails, history of ingrowns  . Diabetes    diabetic foot exam    86 y.o. male presents with the above complaint. History confirmed with patient.   Objective:  Physical Exam: warm, good capillary refill, no trophic changes or ulcerative lesions and normal DP and PT pulses.  Onychomycosis x10  Assessment:   1. Onychomycosis   2. Pain due to onychomycosis of toenails of both feet   3. Type 2 diabetes mellitus with other specified complication, with long-term current use of insulin ([place])      Plan:  Patient was evaluated and treated and all questions answered.   Patient educated on diabetes. Discussed proper diabetic foot care and discussed risks and complications of disease. Educated patient in depth on reasons to return to the office immediately should he/she discover anything concerning or new on the feet. All questions answered. Discussed proper shoes as well.   Discussed the etiology and treatment options for the condition in detail with the patient. Educated patient on the topical and oral treatment options for mycotic nails. Recommended debridement of the nails today. Sharp and mechanical debridement performed of all painful and mycotic nails today. Nails debrided in length and thickness using a nail nipper and a mechanical burr to level of comfort. Discussed treatment options including appropriate shoe gear. Follow up as needed for painful nails.    Return in about 3 months (around 06/10/2020) for [facility].

## 2020-06-06 ENCOUNTER — Encounter (HOSPITAL_COMMUNITY): Payer: Self-pay

## 2020-06-06 ENCOUNTER — Emergency Department (HOSPITAL_COMMUNITY)
Admission: EM | Admit: 2020-06-06 | Discharge: 2020-06-06 | Disposition: A | Discharge status: Home / Self Care | Payer: Medicare Other | Attending: Emergency Medicine | Admitting: Emergency Medicine

## 2020-06-06 ENCOUNTER — Other Ambulatory Visit: Payer: Self-pay

## 2020-06-06 DIAGNOSIS — Z7984 Long term (current) use of oral hypoglycemic drugs: Secondary | ICD-10-CM | POA: Insufficient documentation

## 2020-06-06 DIAGNOSIS — E119 Type 2 diabetes mellitus without complications: Secondary | ICD-10-CM | POA: Insufficient documentation

## 2020-06-06 DIAGNOSIS — F05 Delirium due to known physiological condition: Secondary | ICD-10-CM | POA: Insufficient documentation

## 2020-06-06 DIAGNOSIS — F0390 Unspecified dementia without behavioral disturbance: Secondary | ICD-10-CM | POA: Insufficient documentation

## 2020-06-06 LAB — CBG MONITORING, ED: Glucose-Capillary: 125 mg/dL — ABNORMAL HIGH (ref 70–99)
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
## 2020-06-06 NOTE — Discharge Instructions (Addendum)
Please follow-up with your doctor.  You may need to consider placement in a nursing home if you are unable to be cared for home.

## 2020-06-12 ENCOUNTER — Other Ambulatory Visit: Payer: Self-pay

## 2020-06-12 ENCOUNTER — Emergency Department (HOSPITAL_COMMUNITY)
Admission: EM | Admit: 2020-06-12 | Discharge: 2020-06-12 | Disposition: A | Discharge status: Home / Self Care | Payer: Medicare Other | Attending: Emergency Medicine | Admitting: Emergency Medicine

## 2020-06-12 ENCOUNTER — Emergency Department (HOSPITAL_COMMUNITY): Payer: Medicare Other

## 2020-06-12 DIAGNOSIS — F0390 Unspecified dementia without behavioral disturbance: Secondary | ICD-10-CM | POA: Diagnosis not present

## 2020-06-12 DIAGNOSIS — M25512 Pain in left shoulder: Secondary | ICD-10-CM | POA: Insufficient documentation

## 2020-06-12 DIAGNOSIS — W06XXXA Fall from bed, initial encounter: Secondary | ICD-10-CM | POA: Diagnosis not present

## 2020-06-12 DIAGNOSIS — Z7984 Long term (current) use of oral hypoglycemic drugs: Secondary | ICD-10-CM | POA: Diagnosis not present

## 2020-06-12 DIAGNOSIS — M545 Low back pain, unspecified: Secondary | ICD-10-CM | POA: Diagnosis present

## 2020-06-12 DIAGNOSIS — E119 Type 2 diabetes mellitus without complications: Secondary | ICD-10-CM | POA: Insufficient documentation

## 2020-06-12 DIAGNOSIS — R519 Headache, unspecified: Secondary | ICD-10-CM | POA: Insufficient documentation

## 2020-06-12 DIAGNOSIS — W19XXXA Unspecified fall, initial encounter: Secondary | ICD-10-CM

## 2020-06-12 NOTE — ED Notes (Signed)
Pt. Discharged with wife. Assistance x3 was needed to help assist the patient.

## 2020-06-12 NOTE — ED Provider Notes (Signed)
[facility] DEPT Provider Note   CSN: [id number] Arrival date & time: 06/12/20  0051     History Chief Complaint  Patient presents with  . Fall    Stanley Gilbert is a 86 y.o. male.  86 year old male here after a fall.  Wife is pleasant history of the patient has severe dementia.  Patient's wife states that he was getting up and trying to transfer from the bed to the chair.  He has 1 hand on each 1 and then fell down on his backside.  He complained of lower back pain at that time and headache so EMS was called brought him here for further evaluation.  On evaluation is also complaining of left shoulder pain.  Moving all his extremities.  No other complaints.   Fall       Past Medical History:  Diagnosis Date  . Diabetes [facility])     Patient Active Problem List   Diagnosis Date Noted  . Type II diabetes mellitus ([place]) 07/27/2013  . Psoriasis and similar disorder 10/23/2012    Past Surgical History:  Procedure Laterality Date  . CATARACT EXTRACTION, BILATERAL    . TONSILLECTOMY AND ADENOIDECTOMY  1965       Family History  Problem Relation Age of Onset  . Arthritis Other        parent  . Lung cancer Other        parent  . Heart disease Other        parent  . Diabetes Other        parent  . Brain cancer Mother        tumor  . Heart disease Father   . Anxiety disorder Sister   . Heart disease Brother   . Diabetes Brother   . Mental illness Sister   . Healthy Daughter     Social History   Tobacco Use  . Smoking status: Never Smoker  . Smokeless tobacco: Never Used  Vaping Use  . Vaping Use: Never used  Substance Use Topics  . Alcohol use: No  . Drug use: Not on file    Home Medications Prior to Admission medications   Medication Sig Start Date End Date Taking? Authorizing Provider  donepezil (ARICEPT) 5 MG tablet Take 1 tablet (5 mg total) by mouth at bedtime. 03/20/19   Tat, Eustace Quail, DO  fluocinonide cream (LIDEX)  0.05 % Apply to affected area 3 times a week as needed. 05/30/14   [provider]  glimepiride (AMARYL) 4 MG tablet Take 2 tablets by mouth twice a day.    [provider]  glucose blood (ONE TOUCH ULTRA TEST) test strip 1 each by Other route as needed. Use as instructed    [provider]  Lancets (ONETOUCH ULTRASOFT) lancets 1 each by Other route as needed. Use as instructed    [provider]  metFORMIN (GLUCOPHAGE) 1000 MG tablet Take 1,000 mg by mouth 2 (two) times daily. 02/27/20   [provider]  metFORMIN (GLUCOPHAGE) 500 MG tablet Take 1,000 mg by mouth 2 (two) times daily with a meal.     [provider]  Multiple Vitamin (MULTIVITAMIN) tablet Take 1 tablet by mouth daily.    [provider]  pioglitazone (ACTOS) 15 MG tablet TAKE 1 TABLET BY MOUTH ONCE DAILY FOR 90 DAYS 08/11/18   [provider]  pravastatin (PRAVACHOL) 20 MG tablet TAKE 1 TABLET BY MOUTH ONCE A WEEK FOR 90 DAYS 02/09/19  [provider]    Allergies    Sitagliptin  Review of Systems   Review of Systems  Unable to perform ROS: Dementia    Physical Exam Updated Vital Signs BP (!) 142/74   Pulse 80   Temp 97.9 F (36.6 C) (Oral)   Resp 18   SpO2 95%   Physical Exam Vitals and nursing note reviewed.  Constitutional:      Appearance: He is well-developed.  HENT:     Head: Normocephalic and atraumatic.     Nose: No congestion or rhinorrhea.     Mouth/Throat:     Mouth: Mucous membranes are moist.     Pharynx: Oropharynx is clear.  Eyes:     Pupils: Pupils are equal, round, and reactive to light.  Cardiovascular:     Rate and Rhythm: Normal rate.  Pulmonary:     Effort: Pulmonary effort is normal. No respiratory distress.  Abdominal:     General: There is no distension.  Musculoskeletal:        General: Tenderness (left shoulder pain) present. Normal range of motion.     Cervical back: Normal range of motion.   Skin:    General: Skin is warm and dry.  Neurological:     General: No focal deficit present.     Mental Status: He is alert.     ED Results / Procedures / Treatments   Labs (all labs ordered are listed, but only abnormal results are displayed) Labs Reviewed - No data to display  EKG None  Radiology DG Thoracic Spine 4V  Result Date: 06/12/2020 CLINICAL DATA:  Fall, dementia, back injury EXAM: THORACIC SPINE - 4+ VIEW COMPARISON:  None. FINDINGS: Findings are correlated with concurrently performed radiographs of the lumbar spine. Normal thoracic kyphosis. No acute fracture or listhesis of the thoracic spine. Vertebral body height and intervertebral disc heights are preserved. Paraspinal soft tissues are unremarkable. IMPRESSION: Negative. Electronically Signed   By: Fidela Salisbury MD   On: 06/12/2020 04:37   DG Lumbar Spine Complete  Result Date: 06/12/2020 CLINICAL DATA:  Fall, dementia, back injury EXAM: LUMBAR SPINE - COMPLETE 4+ VIEW COMPARISON:  None. FINDINGS: Five non rib bearing segments of the lumbar spine. Normal lumbar lordosis. No acute fracture or listhesis of the lumbar spine. Vertebral body height is preserved. There is mild intervertebral disc space narrowing at L1-2 in keeping with changes of mild degenerative disc disease. Remaining intervertebral disc heights are preserved. Atherosclerotic calcification is seen within the abdominal aorta anterior to the lumbar spine. IMPRESSION: Negative. Electronically Signed   By: Fidela Salisbury MD   On: 06/12/2020 04:39   DG Pelvis 1-2 Views  Result Date: 06/12/2020 CLINICAL DATA:  Fall, pelvic injury, dementia EXAM: PELVIS - 1-2 VIEW COMPARISON:  None. FINDINGS: Single view radiograph of the pelvis demonstrates normal alignment. No fracture or dislocation. Bilateral hip joint space narrowing is present in keeping with changes of mild degenerative arthritis. Mild vascular calcifications are seen within the pelvis. IMPRESSION: No  acute fracture or dislocation. Electronically Signed   By: Fidela Salisbury MD   On: 06/12/2020 04:40   CT Head Wo Contrast  Result Date: 06/12/2020 CLINICAL DATA:  Neck trauma, un witnessed fall, head injury EXAM: CT HEAD WITHOUT CONTRAST CT CERVICAL SPINE WITHOUT CONTRAST TECHNIQUE: Multidetector CT imaging of the head and cervical spine was performed following the standard protocol without intravenous contrast. Multiplanar CT image reconstructions of the cervical spine were also generated. COMPARISON:  MRI head 09/03/2019 FINDINGS:  CT HEAD FINDINGS Brain: Normal anatomic configuration. Parenchymal volume loss is commensurate with the patient's age. Mild periventricular white matter changes are present likely reflecting the sequela of small vessel ischemia. 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 No abnormal intra or extra-axial fluid collection. No abnormal mass effect or midline shift. No evidence of acute intracranial hemorrhage or infarct. Ventricular size is normal. Cerebellum unremarkable. Vascular: No asymmetric hyperdense vasculature at the skull base. Skull: Intact Sinuses/Orbits: Mild mucosal thickening within the maxillary sinuses bilaterally. Remaining paranasal sinuses are clear. Orbits are unremarkable. Other: Mastoid air cells and middle ear cavities are clear. CT CERVICAL SPINE FINDINGS Alignment: There is normal cervical lordosis.  No listhesis. Skull base and vertebrae: The craniocervical junction is unremarkable. Degenerative changes are noted at the atlantodental articulation. The interval is not widened. No acute fracture of the cervical spine. No lytic or blastic bone lesion. Soft tissues and spinal canal: No prevertebral fluid or swelling. No visible canal hematoma. Disc levels: There is intervertebral disc space narrowing with degenerative calcification of the intervertebral disc at C5-6 in keeping with changes of  advanced degenerative disc disease. Small posterior disc herniations are noted at C2-3, C3-4, C4-5, and C6-7, in keeping with milder degenerative disc disease at these levels. Posterior disc osteophyte complex at C5-6 slightly effaces the anterior canal space without significant mass effect upon the thecal sac. Similarly, posterior disc herniation at C3-4 mildly effaces the anterior canal space. The spinal canal is otherwise widely patent. The prevertebral soft tissues are not thickened. Review of the axial images demonstrates multilevel uncovertebral and facet arthrosis resulting in multilevel neural foraminal narrowing, most severe bilaterally at C5-6. Upper chest: Unremarkable Other: None significant IMPRESSION: No acute intracranial abnormality.  No calvarial fracture. No acute fracture or listhesis of the cervical spine. Electronically Signed   By: Fidela Salisbury MD   On: 06/12/2020 04:28   CT Cervical Spine Wo Contrast  Result Date: 06/12/2020 CLINICAL DATA:  Neck trauma, un witnessed fall, head injury EXAM: CT HEAD WITHOUT CONTRAST CT CERVICAL SPINE WITHOUT CONTRAST TECHNIQUE: Multidetector CT imaging of the head and cervical spine was performed following the standard protocol without intravenous contrast. Multiplanar CT image reconstructions of the cervical spine were also generated. COMPARISON:  MRI head 09/03/2019 FINDINGS: CT HEAD FINDINGS Brain: Normal anatomic configuration. Parenchymal volume loss is commensurate with the patient's age. Mild periventricular white matter changes are present likely reflecting the sequela of small vessel ischemia. 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 No abnormal intra or extra-axial fluid collection. No abnormal mass effect or midline shift. No evidence of acute intracranial hemorrhage or infarct. Ventricular size is normal. Cerebellum unremarkable. Vascular: No asymmetric hyperdense vasculature at  the skull base. Skull: Intact Sinuses/Orbits: Mild mucosal thickening within the maxillary sinuses bilaterally. Remaining paranasal sinuses are clear. Orbits are unremarkable. Other: Mastoid air cells and middle ear cavities are clear. CT CERVICAL SPINE FINDINGS Alignment: There is normal cervical lordosis.  No listhesis. Skull base and vertebrae: The craniocervical junction is unremarkable. Degenerative changes are noted at the atlantodental articulation. The interval is not widened. No acute fracture of the cervical spine. No lytic or blastic bone lesion. Soft tissues and spinal canal: No prevertebral fluid or swelling. No visible canal hematoma. Disc levels: There is intervertebral disc space narrowing with degenerative calcification of the intervertebral disc at C5-6 in keeping with changes of advanced degenerative disc  disease. Small posterior disc herniations are noted at C2-3, C3-4, C4-5, and C6-7, in keeping with milder degenerative disc disease at these levels. Posterior disc osteophyte complex at C5-6 slightly effaces the anterior canal space without significant mass effect upon the thecal sac. Similarly, posterior disc herniation at C3-4 mildly effaces the anterior canal space. The spinal canal is otherwise widely patent. The prevertebral soft tissues are not thickened. Review of the axial images demonstrates multilevel uncovertebral and facet arthrosis resulting in multilevel neural foraminal narrowing, most severe bilaterally at C5-6. Upper chest: Unremarkable Other: None significant IMPRESSION: No acute intracranial abnormality.  No calvarial fracture. No acute fracture or listhesis of the cervical spine. Electronically Signed   By: Fidela Salisbury MD   On: 06/12/2020 04:28   DG Shoulder Left  Result Date: 06/12/2020 CLINICAL DATA:  Fall, dementia, left shoulder injury EXAM: LEFT SHOULDER - 2+ VIEW COMPARISON:  None. FINDINGS: Four view radiograph left shoulder demonstrates normal alignment. No  fracture or dislocation. Glenohumeral joint space is preserved. Moderate degenerative arthritis of the acromioclavicular joint. Visualized left hemithorax is unremarkable. IMPRESSION: No acute fracture or dislocation. Electronically Signed   By: Fidela Salisbury MD   On: 06/12/2020 04:36    Procedures Procedures (including critical care time)  Medications Ordered in ED Medications - No data to display  ED Course  I have reviewed the triage vital signs and the nursing notes.  Pertinent labs & imaging results that were available during my care of the patient were reviewed by me and considered in my medical decision making (see chart for details).    MDM Rules/Calculators/A&P                          No fractures or obvious traumatic injuries.  DNR filled out per wife's request. Attempted to fill out MOST form for her as well however she stated she was too tired to fill all of that out.   Final Clinical Impression(s) / ED Diagnoses Final diagnoses:  Fall, initial encounter    Rx / DC Orders ED Discharge Orders    None       Jeramy Dimmick, Corene Cornea, MD 06/12/20 [phone]

## 2020-06-12 NOTE — ED Triage Notes (Signed)
Patient had a witnessed fall. A&O hx of dementia. No LOC. Arrived by EMS. Complaint of some pain in his back region.  144/72-82-98% RA- CBG:98

## 2020-06-12 NOTE — ED Notes (Signed)
Pt. Documented in error see above note in chart. 

## 2020-08-05 DIAGNOSIS — G301 Alzheimer's disease with late onset: Secondary | ICD-10-CM | POA: Diagnosis not present

## 2020-08-05 DIAGNOSIS — E1169 Type 2 diabetes mellitus with other specified complication: Secondary | ICD-10-CM | POA: Diagnosis not present

## 2020-08-05 DIAGNOSIS — R519 Headache, unspecified: Secondary | ICD-10-CM | POA: Diagnosis not present

## 2020-08-05 DIAGNOSIS — R609 Edema, unspecified: Secondary | ICD-10-CM | POA: Diagnosis not present

## 2020-08-05 DIAGNOSIS — R29898 Other symptoms and signs involving the musculoskeletal system: Secondary | ICD-10-CM | POA: Diagnosis not present

## 2020-08-05 DIAGNOSIS — Z7984 Long term (current) use of oral hypoglycemic drugs: Secondary | ICD-10-CM | POA: Diagnosis not present

## 2020-08-05 DIAGNOSIS — R234 Changes in skin texture: Secondary | ICD-10-CM | POA: Diagnosis not present

## 2020-08-05 DIAGNOSIS — G214 Vascular parkinsonism: Secondary | ICD-10-CM | POA: Diagnosis not present

## 2020-08-05 DIAGNOSIS — Z7189 Other specified counseling: Secondary | ICD-10-CM | POA: Diagnosis not present

## 2020-08-12 DIAGNOSIS — R410 Disorientation, unspecified: Secondary | ICD-10-CM | POA: Diagnosis not present

## 2020-09-15 ENCOUNTER — Telehealth: Payer: Self-pay

## 2020-09-15 NOTE — Telephone Encounter (Signed)
Spoke with patient's wife [name] and scheduled an in-person Palliative Consult for 09/24/20 @ 2:30PM  COVID screening was negative. Only pet birds in the home. Patient lives with wife.   Consent obtained; updated Outlook/Netsmart/Team List and Epic.  Family is aware they may be receiving a call from NP the day before or day of to confirm appointment.

## 2020-09-24 ENCOUNTER — Other Ambulatory Visit: Payer: Medicare Other | Admitting: Student

## 2020-09-24 ENCOUNTER — Other Ambulatory Visit: Payer: Self-pay

## 2020-09-24 DIAGNOSIS — Z515 Encounter for palliative care: Secondary | ICD-10-CM

## 2020-09-24 NOTE — Progress Notes (Signed)
[facility] Consult Note Telephone: [phone]  Fax: [phone]  PATIENT NAME: Stanley Gilbert [address] [place] [id number] [phone] (home)  DOB: 1930-09-01 MRN: [id number]  PRIMARY CARE PROVIDER:    [name], MD,  [place] [address] [phone]  REFERRING PROVIDER:   [name], [place],   [address] [phone]  RESPONSIBLE PARTY:   Extended Emergency Contact Information Primary Emergency Contact: [facility] Address: [address]          Birdie Sons Mobile Phone: [phone] Relation: Spouse Secondary Emergency Contact: [name] Mobile Phone: [phone] Relation: Daughter  I met face to face with patient and family in home/facility. Patient present but unable to substantively engage in process.   ASSESSMENT AND RECOMMENDATIONS:   Advance Care Planning: Visit at the request of Dr. Harrington Challenger for palliative consult. Visit consisted of building trust and discussions on Palliative care medicine as specialized medical care for people living with serious illness, aimed at facilitating improved quality of life through symptoms relief, assisting with advance care planning and establishing goals of care. Education provided on Palliative Medicine vs. Hospice services. Palliative care will continue to provide support to patient, family and the medical team.  Goal of care: To maintain current level of care, remain in the home.   Directives: DNR; will discuss MOST form on next visit.  Symptom Management:   Alzheimer's dementia-FAST score 6D. Patient requires assist with adl's. Cueing/redirection as needed. Patient not taking donepezil, quetiapine, trazodone, ativan as wife felt his behaviors were worsen. Recommend olanzapine 2.63m daily should behaviors, agitation worsen. Monitor for safety/falls.   Edema-patient with lower  extremity edema. Wear compression hose/socks daily, elevate feet/legs while in chair or bed. Monitor salt intake.   Follow up Palliative Care Visit: Palliative care will continue to follow for complex decision making and symptom management. Return in 4 weeks or prn.  Family /Caregiver/Community Supports: Palliative Medicine will continue to provide support. Discussed long range planning and the need for support in the home in near future; wife declines need at present. She is in process of trying to get patient VA benefits.    I spent 60 minutes providing this consultation, from 1:00pm to 2:00pm. Time includes time spent with patient/family, chart review, provider coordination, and documentation. More than 50% of the time in this consultation was spent counseling and coordinating communication.   CHIEF COMPLAINT: Palliative Medicine initial visit.   History obtained from review of EMR, discussion with primary team, and  interview with family. Records reviewed and summarized below.  HISTORY OF PRESENT ILLNESS:  Stanley Gilbert Stanley Gilbert a 87y.o. year old male with multiple medical problems including Alzheimer's dementia, T2DM, recurrent falls. Palliative Care was asked to follow this patient by consultation request of RLawerance Cruel MD to help address advance care planning and goals of care. This is an initial visit.  Patient lives at home with wife; she assists with adl's. Patient does "sundown." She fills medication box; prompts patient to take medications. Wife stopped his donepezil, trazodone, lorazepam, quetiapine; she felt his behaviors worsened with the medications. Sleep is fair; naps intermittently during the day. Ambulates without walker and cane. Patient received several sessions of PT/OT; stopped 1 month ago. Last fall about a month ago. Good appetite per patient and wife. Blood sugar checks have been in 80-120's mg/dL.Patient was in [employer], wife is currently attempting to get VA  benefits.  She received a letter stating they do not have record/proof and she can reapply; she will need to re submit application in 1 year.  Patient has a follow up appointment with Dr. Harrington Challenger on the 28th.    CODE STATUS: DNR  PPS: 50%  HOSPICE ELIGIBILITY/DIAGNOSIS: TBD  ROS   General: NAD EYES: denies vision changes ENMT: denies dysphagia Cardiovascular: denies chest pain Pulmonary: denies cough, denies increased SOB Abdomen: endorses good appetite, continent of bowel GU: denies dysuria MSK:  last fall 1 month ago  Skin: denies rashes or wounds Neurological: endorses weakness Psych: Endorses positive mood Heme/lymph/immuno: denies bruises, abnormal bleeding   Physical Exam: Pulse 80, resp 16, blood pressure 110/70, sats 98% on room air  Constitutional: NAD General: A & O x 2, frail appearing EYES: anicteric sclera, lids intact, no discharge  ENMT: intact hearing,oral mucous membranes moist CV: RRR, 2+ LE edema Pulmonary: LCTA, no increased work of breathing, no cough Abdomen: bowel sounds normoactive x 4 GU: deferred MSK: moves all extremities, ambulatory without assistive device Skin: warm and dry, no rashes or wounds on visible skin Neuro: Generalized weakness, forgetful Psych: pleasant, non anxious affect  Hem/lymph/immuno: no widespread bruising   PAST MEDICAL HISTORY:  Past Medical History:  Diagnosis Date  . Diabetes ([place])     SOCIAL HX:  Social History   Tobacco Use  . Smoking status: Never Smoker  . Smokeless tobacco: Never Used  Substance Use Topics  . Alcohol use: No   FAMILY HX:  Family History  Problem Relation Age of Onset  . Arthritis Other        parent  . Lung cancer Other        parent  . Heart disease Other        parent  . Diabetes Other        parent  . Brain cancer Mother        tumor  . Heart disease Father   . Anxiety disorder Sister   . Heart disease Brother   . Diabetes Brother   . Mental illness Sister   . Healthy  Daughter     ALLERGIES:  Allergies  Allergen Reactions  . Sitagliptin     Other reaction(s): ABDOMINAL PAIN     PERTINENT MEDICATIONS:  Outpatient Encounter Medications as of 09/24/2020  Medication Sig  . donepezil (ARICEPT) 5 MG tablet Take 1 tablet (5 mg total) by mouth at bedtime.  . fluocinonide cream (LIDEX) 0.05 % Apply to affected area 3 times a week as needed.  Marland Kitchen glimepiride (AMARYL) 4 MG tablet Take 2 tablets by mouth twice a day.  Marland Kitchen glucose blood (ONE TOUCH ULTRA TEST) test strip 1 each by Other route as needed. Use as instructed  . Lancets (ONETOUCH ULTRASOFT) lancets 1 each by Other route as needed. Use as instructed  . metFORMIN (GLUCOPHAGE) 1000 MG tablet Take 1,000 mg by mouth 2 (two) times daily.  . metFORMIN (GLUCOPHAGE) 500 MG tablet Take 1,000 mg by mouth 2 (two) times daily with a meal.   . Multiple Vitamin (MULTIVITAMIN) tablet Take 1 tablet by mouth daily.  . pioglitazone (ACTOS) 15 MG tablet TAKE 1 TABLET BY MOUTH ONCE DAILY FOR 90 DAYS  . pravastatin (PRAVACHOL) 20 MG tablet TAKE 1 TABLET BY MOUTH ONCE A WEEK FOR 90 DAYS   No facility-administered encounter medications on file as of 09/24/2020.     Thank you for the opportunity to participate in the care of Mr. Huegel. The palliative care  team will continue to follow. Please call our office at [phone] if we can be of additional assistance.  Ezekiel Slocumb, NP

## 2020-10-01 DIAGNOSIS — L909 Atrophic disorder of skin, unspecified: Secondary | ICD-10-CM | POA: Diagnosis not present

## 2020-10-01 DIAGNOSIS — R519 Headache, unspecified: Secondary | ICD-10-CM | POA: Diagnosis not present

## 2020-10-01 DIAGNOSIS — R609 Edema, unspecified: Secondary | ICD-10-CM | POA: Diagnosis not present

## 2020-10-29 ENCOUNTER — Other Ambulatory Visit: Payer: Medicare Other | Admitting: Student

## 2020-10-29 ENCOUNTER — Other Ambulatory Visit: Payer: Self-pay

## 2020-10-29 DIAGNOSIS — Z515 Encounter for palliative care: Secondary | ICD-10-CM | POA: Diagnosis not present

## 2020-10-29 DIAGNOSIS — G301 Alzheimer's disease with late onset: Secondary | ICD-10-CM

## 2020-10-29 DIAGNOSIS — R600 Localized edema: Secondary | ICD-10-CM | POA: Diagnosis not present

## 2020-10-29 DIAGNOSIS — F0281 Dementia in other diseases classified elsewhere with behavioral disturbance: Secondary | ICD-10-CM

## 2020-10-29 NOTE — Progress Notes (Signed)
Stanley Gilbert Consult Note Telephone: [phone]  Fax: [phone]    Date of encounter: 10/29/20 PATIENT NAME: Stanley Gilbert [address] [place] [address]   [phone] (home)  DOB: 12/23/1930 MRN: [id number] PRIMARY CARE PROVIDER:    [name], MD,  [place] [address] [phone]  REFERRING PROVIDER:   [name], MD Stanley Gilbert,  Stanley Gilbert [address] [phone]  RESPONSIBLE PARTY:    Contact Information    Name Relation Home Work Mobile   Stanley Gilbert Spouse   [phone]   Stanley Gilbert Daughter   [phone]       I met face to face with patient and family in the home. Palliative Care was asked to follow this patient by consultation request of  Stanley Cruel, MD to address advance care planning and complex medical decision making. This is a follow up visit.                                   ASSESSMENT AND PLAN / RECOMMENDATIONS:   Advance Care Planning/Goals of Care: Goals include to maximize quality of life and symptom management. To maintain current level of care, remain in the home. Our advance care planning conversation included a discussion about:     The value and importance of advance care planning   Experiences with loved ones who have been seriously ill or have died   Exploration of personal, cultural or spiritual beliefs that might influence medical decisions   Exploration of goals of care in the event of a sudden injury or illness   Identification and preparation of a healthcare agent   MOST form filled out with PCP per wife  CODE STATUS: DNR  Symptom Management/Plan:  Alzheimer's dementia-FAST score 6D. Patient requires assist with adl's.Patient continues to sundown. Discussed recommendation of  olanzapine 2.20m daily for agitation. Wife not in agreement with new medication at this time. Recommend lavender oil as an option to  help with calming. Continue redirecting/reorienting as needed. Monitor for safety/falls.   Edema-patient with lower extremity edema. Continue furosemide 23mdaily; recommend taking an additional tablet =4045mn days where edema is worse. Recommend wearing compression hose/socks daily, elevate legs when sitting in recliner. Monitor sodium/salt intake.    Follow up Palliative Care Visit: Palliative care will continue to follow for complex medical decision making, advance care planning, and clarification of goals. Return in 6 weeks or prn.  I spent 40 minutes providing this consultation. More than 50% of the time in this consultation was spent in counseling and care coordination.   PPS: 50%  HOSPICE ELIGIBILITY/DIAGNOSIS: TBD  Chief Complaint: Palliative Medicine follow up visit, dementia.  HISTORY OF PRESENT ILLNESS:  Stanley Gilbert a 89 26o. year old male  with Alzheimer's dementia,  recurrent falls. T2DM currently managed with medication and diet.  Patient resides at home with wife. No changes in communication; continues with word finding difficulty. Sundowns, paces, walks about. Agitation reported; redirected by wife or neighbor. Appetite is good; blood sugars checked daily. Blood sugars range from 95-115m59m. No hypoglycemic episodes reported. Sleep is fair; naps during the day. No recent falls, infections, ER visits or hospitalizations.   History obtained from review of EMR, discussion with primary team, and interview with family, facility staff/caregiver and/or Stanley Gilbert reviewed available labs, medications, imaging, studies and related documents from  the EMR.  Records reviewed and summarized above.   ROS   General: NAD EYES: no vision changes ENMT: denies dysphagia Cardiovascular: denies chest pain, denies DOE Pulmonary: denies cough, SOB w/exertion Abdomen: endorses good appetite, denies constipation, continence of bowel GU: denies dysuria MSK:  denies weakness Skin:  denies rashes or wounds Neurological: denies pain Psych: Endorses positive mood Heme/lymph/immuno: denies bruises, abnormal bleeding  Physical Exam: Pulse 68, resp16, blood pressure 120/62, sats 99% on room air Constitutional: NAD General: well nourished, well developed EYES: anicteric sclera, lids intact, no discharge  ENMT: intact hearing, oral mucous membranes moist, dentition intact CV: S1S2, RRR, 2 +pitting LE edema Pulmonary: LCTA, no increased work of breathing, no cough Abdomen: normo-active BS + 4 quadrants, soft and non tender GU: deferred MSK: no sarcopenia, moves all extremities, ambulatory Skin: warm and dry, no rashes or wounds on visible skin Neuro:  no generalized weakness, forgetful, difficulty with word finding Psych: non-anxious affect, A & O x 2 Hem/lymph/immuno: no widespread bruising   Thank you for the opportunity to participate in the care of Stanley Gilbert.  The palliative care team will continue to follow. Please call our office at [phone] if we can be of additional assistance.   Stanley Slocumb, NP   COVID-19 PATIENT SCREENING TOOL Asked and negative response unless otherwise noted:   Have you had symptoms of covid, tested positive or been in contact with someone with symptoms/positive test in the past 5-10 days?

## 2020-11-03 ENCOUNTER — Telehealth: Payer: Self-pay | Admitting: Student

## 2020-11-03 DIAGNOSIS — R519 Headache, unspecified: Secondary | ICD-10-CM | POA: Diagnosis not present

## 2020-11-03 NOTE — Telephone Encounter (Signed)
Palliative NP spoke with patient's wife to provide number for [facility] rep with [facility]. F/u appt scheduled for 12/15/20 at 2pm.

## 2020-12-15 ENCOUNTER — Other Ambulatory Visit: Payer: Medicare Other | Admitting: Student

## 2020-12-15 ENCOUNTER — Other Ambulatory Visit: Payer: Self-pay

## 2020-12-15 DIAGNOSIS — Z515 Encounter for palliative care: Secondary | ICD-10-CM | POA: Diagnosis not present

## 2020-12-15 DIAGNOSIS — R600 Localized edema: Secondary | ICD-10-CM

## 2020-12-15 DIAGNOSIS — F02818 Dementia in other diseases classified elsewhere, unspecified severity, with other behavioral disturbance: Secondary | ICD-10-CM

## 2020-12-15 DIAGNOSIS — G301 Alzheimer's disease with late onset: Secondary | ICD-10-CM | POA: Diagnosis not present

## 2020-12-15 NOTE — Progress Notes (Signed)
[facility] Consult Note Telephone: [phone]  Fax: [phone]    Date of encounter: 12/15/20 PATIENT NAME: Stanley Gilbert [address] [place] [address]   [phone] (home)  DOB: [birth year] MRN: [id number] PRIMARY CARE PROVIDER:    [name], MD,  [place] [address] [phone]  REFERRING PROVIDER:   [name], MD [place],  [place] [address] [phone]  RESPONSIBLE PARTY:    Contact Information    Name Relation Home Work Mobile   [facility] Spouse   [phone]   Stanley Gilbert Daughter   [phone]       I met face to face with patient and family in the home. Palliative Care was asked to follow this patient by consultation request of  [name], MD to address advance care planning and complex medical decision making. This is a follow up visit.                                   ASSESSMENT AND PLAN / RECOMMENDATIONS:   Advance Care Planning/Goals of Care: Goals include to maximize quality of life and symptom management. Our advance care planning conversation included a discussion about:     The value and importance of advance care planning   Experiences with loved ones who have been seriously ill or have died   Exploration of personal, cultural or spiritual beliefs that might influence medical decisions   Exploration of goals of care in the event of a sudden injury or illness   MOST form filled out with PCP per wife  CODE STATUS: DNR  Symptom Management/Plan:  Alzheimer's dementia-FAST score 6D. Patient requires assist with adl's. Patient continues to sundown. Wife not in agreement with new medication at this time. Recommend lavender oil as an option to help with calming. Wife administers tylenol PM at bedtime to aid in sleep. Continue redirecting/reorienting as needed. Monitor for safety/falls.   Edema-patient with lower  extremity edema. Continue furosemide 64m daily; recommend taking an additional tablet =480mon days where edema is worse. Recommend wearing compression hose/socks daily, elevate legs when sitting in recliner. Monitor sodium/salt intake.     Follow up Palliative Care Visit: Palliative care will continue to follow for complex medical decision making, advance care planning, and clarification of goals. Return in 8 weeks or prn.  I spent 40 minutes providing this consultation. More than 50% of the time in this consultation was spent in counseling and care coordination.   PPS: 50%  HOSPICE ELIGIBILITY/DIAGNOSIS: TBD  Chief Complaint: Palliative Medicine follow up visit.   HISTORY OF PRESENT ILLNESS:  DoDECODA VANs a [age].o. year old male  with Alzheimer's dementia,  recurrent falls. T2DM currently managed with medication and diet.    Wife and patient report patient doing well. Denies pain except occasional headaches. He takes tylenol prn with effectiveness. Denies chest pain, shortness of breath. Denies nausea or constipation. Checking blood sugar each AM, 98 this am. Usually around 10042mL. No recent falls. Ambulating with assistance. Wife was advised by family/friends to apply for Medicaid. She has been unsuccessful with [facility] benefits for patient.  History obtained from review of EMR, discussion with primary team, and interview with family, facility staff/caregiver and/or Mr. StoEverettI reviewed available labs, medications, imaging, studies and related documents from the EMR.  Records reviewed and summarized above.  ROS  General: NAD EYES: denies vision changes ENMT: denies dysphagia Cardiovascular: denies chest pain Pulmonary: denies cough, denies increased SOB Abdomen: endorses good appetite, denies constipation GU: denies dysuria MSK:  weakness, no recent falls reported Skin: denies rashes or wounds Neurological: sleeping well Psych: Endorses positive mood Heme/lymph/immuno:  denies bruises, abnormal bleeding  Physical Exam:  Pulse 76, resp 16, b/p 122/54, sats 98% on room air Constitutional: NAD General: frail appearing EYES: anicteric sclera, lids intact, no discharge  ENMT: intact hearing, oral mucous membranes moist, dentition intact CV: S1S2, RRR, 1-2+ LE edema Pulmonary: LCTA, no increased work of breathing, no cough, room air Abdomen: normo-active BS + 4 quadrants, soft and non tender GU: deferred MSK: moves all extremities, ambulatory Skin: warm and dry, no rashes or wounds on visible skin Neuro: generalized weakness, A & O x 2, forgetful Psych: non-anxious affect Hem/lymph/immuno: no widespread bruising   Thank you for the opportunity to participate in the care of Mr. Moll.  The palliative care team will continue to follow. Please call our office at [phone] if we can be of additional assistance.   Ezekiel Slocumb, NP   COVID-19 PATIENT SCREENING TOOL Asked and negative response unless otherwise noted:   Have you had symptoms of covid, tested positive or been in contact with someone with symptoms/positive test in the past 5-10 days? No

## 2020-12-23 DIAGNOSIS — H43391 Other vitreous opacities, right eye: Secondary | ICD-10-CM | POA: Diagnosis not present

## 2020-12-23 DIAGNOSIS — E119 Type 2 diabetes mellitus without complications: Secondary | ICD-10-CM | POA: Diagnosis not present

## 2020-12-23 DIAGNOSIS — H04121 Dry eye syndrome of right lacrimal gland: Secondary | ICD-10-CM | POA: Diagnosis not present

## 2021-01-01 ENCOUNTER — Other Ambulatory Visit: Payer: Medicare Other

## 2021-01-01 ENCOUNTER — Other Ambulatory Visit: Payer: Self-pay

## 2021-01-01 DIAGNOSIS — Z515 Encounter for palliative care: Secondary | ICD-10-CM

## 2021-01-02 NOTE — Progress Notes (Signed)
PALLIATIVE SOCIAL WORK VISIT Social work completed a visit with patient and his PCG/wife at his home.SW observed patient sitting, quietly. He was verbal only when prompted and his responded with short yes/no answers. He denied pain. SW sat with patient and his wife, providing supportive counseling, active listening, education and resources. PCG advised that she would like to get some additional help in the home. She is unable to leave patient alone and have limiting funds and support to assist her. She does have a neighbor who will sit with patient while she runs an errand, but that neighbor has limited time now that he is caring for a sick family member. She advised that she had applied for [facility] benefits, but patient was turned down. She was not sure why, but feels he is entitled to some type of help. SW provided her with a resource that may be able to help her ([place]). SW also provided her with agencies and programs ([employer]) that could also be helpful to her. SW provided her a Insurance underwriter and education regarding how to apply on line or in-person. PCG was open to the education and resources provided. SW provided validation of feelings of caregiver burnout and encouraged self-care. SW encouraged her to call with any additional questions or concerns.   *NP updated

## 2021-02-02 DIAGNOSIS — R400 Somnolence: Secondary | ICD-10-CM | POA: Diagnosis not present

## 2021-02-02 DIAGNOSIS — G301 Alzheimer's disease with late onset: Secondary | ICD-10-CM | POA: Diagnosis not present

## 2021-02-02 DIAGNOSIS — D485 Neoplasm of uncertain behavior of skin: Secondary | ICD-10-CM | POA: Diagnosis not present

## 2021-02-06 ENCOUNTER — Telehealth: Payer: Self-pay | Admitting: Student

## 2021-02-06 NOTE — Telephone Encounter (Signed)
Returned call to patient's wife. She had question regarding next appointment. She states someone had called yesterday to reschedule appointment to 8/4. She states she had to use life alert for assistance with getting patient out of floor as she had to sit him down to keep from falling.

## 2021-02-12 DIAGNOSIS — R296 Repeated falls: Secondary | ICD-10-CM | POA: Diagnosis not present

## 2021-02-12 DIAGNOSIS — Z515 Encounter for palliative care: Secondary | ICD-10-CM | POA: Diagnosis not present

## 2021-02-12 DIAGNOSIS — E1149 Type 2 diabetes mellitus with other diabetic neurological complication: Secondary | ICD-10-CM | POA: Diagnosis not present

## 2021-02-12 DIAGNOSIS — G301 Alzheimer's disease with late onset: Secondary | ICD-10-CM | POA: Diagnosis not present

## 2021-02-17 ENCOUNTER — Telehealth: Payer: Self-pay

## 2021-02-17 NOTE — Telephone Encounter (Signed)
Order for [place] faxed to [facility] for PT/OT

## 2021-02-19 ENCOUNTER — Emergency Department (HOSPITAL_COMMUNITY): Payer: Medicare Other

## 2021-02-19 ENCOUNTER — Other Ambulatory Visit: Payer: Self-pay

## 2021-02-19 ENCOUNTER — Encounter (HOSPITAL_COMMUNITY): Payer: Self-pay

## 2021-02-19 ENCOUNTER — Emergency Department (HOSPITAL_COMMUNITY)
Admission: EM | Admit: 2021-02-19 | Discharge: 2021-02-20 | Disposition: A | Discharge status: Home / Self Care | Payer: Medicare Other | Source: Home / Self Care | Attending: Emergency Medicine | Admitting: Emergency Medicine

## 2021-02-19 DIAGNOSIS — F0390 Unspecified dementia without behavioral disturbance: Secondary | ICD-10-CM | POA: Insufficient documentation

## 2021-02-19 DIAGNOSIS — R0902 Hypoxemia: Secondary | ICD-10-CM | POA: Diagnosis not present

## 2021-02-19 DIAGNOSIS — R5381 Other malaise: Secondary | ICD-10-CM | POA: Diagnosis not present

## 2021-02-19 DIAGNOSIS — J90 Pleural effusion, not elsewhere classified: Secondary | ICD-10-CM | POA: Diagnosis not present

## 2021-02-19 DIAGNOSIS — R627 Adult failure to thrive: Secondary | ICD-10-CM | POA: Diagnosis not present

## 2021-02-19 DIAGNOSIS — R4182 Altered mental status, unspecified: Secondary | ICD-10-CM | POA: Insufficient documentation

## 2021-02-19 DIAGNOSIS — Z808 Family history of malignant neoplasm of other organs or systems: Secondary | ICD-10-CM | POA: Diagnosis not present

## 2021-02-19 DIAGNOSIS — R791 Abnormal coagulation profile: Secondary | ICD-10-CM | POA: Insufficient documentation

## 2021-02-19 DIAGNOSIS — R509 Fever, unspecified: Secondary | ICD-10-CM | POA: Diagnosis not present

## 2021-02-19 DIAGNOSIS — Z79899 Other long term (current) drug therapy: Secondary | ICD-10-CM | POA: Diagnosis not present

## 2021-02-19 DIAGNOSIS — C259 Malignant neoplasm of pancreas, unspecified: Secondary | ICD-10-CM | POA: Diagnosis not present

## 2021-02-19 DIAGNOSIS — R109 Unspecified abdominal pain: Secondary | ICD-10-CM | POA: Diagnosis not present

## 2021-02-19 DIAGNOSIS — R6889 Other general symptoms and signs: Secondary | ICD-10-CM | POA: Diagnosis not present

## 2021-02-19 DIAGNOSIS — K8689 Other specified diseases of pancreas: Secondary | ICD-10-CM | POA: Diagnosis not present

## 2021-02-19 DIAGNOSIS — E876 Hypokalemia: Secondary | ICD-10-CM | POA: Diagnosis not present

## 2021-02-19 DIAGNOSIS — E119 Type 2 diabetes mellitus without complications: Secondary | ICD-10-CM | POA: Insufficient documentation

## 2021-02-19 DIAGNOSIS — Z801 Family history of malignant neoplasm of trachea, bronchus and lung: Secondary | ICD-10-CM | POA: Diagnosis not present

## 2021-02-19 DIAGNOSIS — L89322 Pressure ulcer of left buttock, stage 2: Secondary | ICD-10-CM | POA: Diagnosis not present

## 2021-02-19 DIAGNOSIS — J918 Pleural effusion in other conditions classified elsewhere: Secondary | ICD-10-CM | POA: Diagnosis not present

## 2021-02-19 DIAGNOSIS — I700 Atherosclerosis of aorta: Secondary | ICD-10-CM | POA: Diagnosis not present

## 2021-02-19 DIAGNOSIS — E11649 Type 2 diabetes mellitus with hypoglycemia without coma: Secondary | ICD-10-CM | POA: Diagnosis not present

## 2021-02-19 DIAGNOSIS — E871 Hypo-osmolality and hyponatremia: Secondary | ICD-10-CM | POA: Diagnosis not present

## 2021-02-19 DIAGNOSIS — R410 Disorientation, unspecified: Secondary | ICD-10-CM | POA: Diagnosis not present

## 2021-02-19 DIAGNOSIS — U071 COVID-19: Secondary | ICD-10-CM | POA: Insufficient documentation

## 2021-02-19 DIAGNOSIS — J1282 Pneumonia due to coronavirus disease 2019: Secondary | ICD-10-CM | POA: Diagnosis not present

## 2021-02-19 DIAGNOSIS — E860 Dehydration: Secondary | ICD-10-CM | POA: Diagnosis not present

## 2021-02-19 DIAGNOSIS — R262 Difficulty in walking, not elsewhere classified: Secondary | ICD-10-CM | POA: Diagnosis not present

## 2021-02-19 DIAGNOSIS — Z4682 Encounter for fitting and adjustment of non-vascular catheter: Secondary | ICD-10-CM | POA: Diagnosis not present

## 2021-02-19 DIAGNOSIS — Z681 Body mass index (BMI) 19 or less, adult: Secondary | ICD-10-CM | POA: Diagnosis not present

## 2021-02-19 DIAGNOSIS — R54 Age-related physical debility: Secondary | ICD-10-CM | POA: Diagnosis not present

## 2021-02-19 DIAGNOSIS — G459 Transient cerebral ischemic attack, unspecified: Secondary | ICD-10-CM | POA: Diagnosis not present

## 2021-02-19 DIAGNOSIS — Z743 Need for continuous supervision: Secondary | ICD-10-CM | POA: Diagnosis not present

## 2021-02-19 DIAGNOSIS — R531 Weakness: Secondary | ICD-10-CM | POA: Diagnosis not present

## 2021-02-19 DIAGNOSIS — R0689 Other abnormalities of breathing: Secondary | ICD-10-CM | POA: Insufficient documentation

## 2021-02-19 DIAGNOSIS — R9431 Abnormal electrocardiogram [ECG] [EKG]: Secondary | ICD-10-CM | POA: Diagnosis not present

## 2021-02-19 DIAGNOSIS — G9341 Metabolic encephalopathy: Secondary | ICD-10-CM | POA: Diagnosis not present

## 2021-02-19 DIAGNOSIS — F05 Delirium due to known physiological condition: Secondary | ICD-10-CM

## 2021-02-19 DIAGNOSIS — R633 Feeding difficulties, unspecified: Secondary | ICD-10-CM | POA: Diagnosis not present

## 2021-02-19 DIAGNOSIS — E1165 Type 2 diabetes mellitus with hyperglycemia: Secondary | ICD-10-CM | POA: Diagnosis not present

## 2021-02-19 DIAGNOSIS — G9340 Encephalopathy, unspecified: Secondary | ICD-10-CM | POA: Diagnosis not present

## 2021-02-19 DIAGNOSIS — Z833 Family history of diabetes mellitus: Secondary | ICD-10-CM | POA: Diagnosis not present

## 2021-02-19 DIAGNOSIS — Z66 Do not resuscitate: Secondary | ICD-10-CM | POA: Diagnosis not present

## 2021-02-19 DIAGNOSIS — R1310 Dysphagia, unspecified: Secondary | ICD-10-CM | POA: Diagnosis not present

## 2021-02-19 DIAGNOSIS — Z7189 Other specified counseling: Secondary | ICD-10-CM | POA: Diagnosis not present

## 2021-02-19 DIAGNOSIS — R404 Transient alteration of awareness: Secondary | ICD-10-CM | POA: Diagnosis not present

## 2021-02-19 DIAGNOSIS — Z515 Encounter for palliative care: Secondary | ICD-10-CM | POA: Diagnosis not present

## 2021-02-19 DIAGNOSIS — Z8249 Family history of ischemic heart disease and other diseases of the circulatory system: Secondary | ICD-10-CM | POA: Diagnosis not present

## 2021-02-19 DIAGNOSIS — Z7984 Long term (current) use of oral hypoglycemic drugs: Secondary | ICD-10-CM | POA: Insufficient documentation

## 2021-02-19 HISTORY — DX: Unspecified dementia, unspecified severity, without behavioral disturbance, psychotic disturbance, mood disturbance, and anxiety: F03.90

## 2021-02-19 LAB — COMPREHENSIVE METABOLIC PANEL
ALT: 12 U/L (ref 0–44)
AST: 19 U/L (ref 15–41)
Albumin: 3.3 g/dL — ABNORMAL LOW (ref 3.5–5.0)
Alkaline Phosphatase: 56 U/L (ref 38–126)
Anion gap: 9 (ref 5–15)
BUN: 17 mg/dL (ref 8–23)
CO2: 27 mmol/L (ref 22–32)
Calcium: 8.1 mg/dL — ABNORMAL LOW (ref 8.9–10.3)
Chloride: 101 mmol/L (ref 98–111)
Creatinine, Ser: 0.73 mg/dL (ref 0.61–1.24)
GFR, Estimated: >60 mL/min (ref >60)
Glucose, Bld: 104 mg/dL — ABNORMAL HIGH (ref 70–99)
Potassium: 4 mmol/L (ref 3.5–5.1)
Sodium: 137 mmol/L (ref 135–145)
Total Bilirubin: 0.6 mg/dL (ref 0.3–1.2)
Total Protein: 6.2 g/dL — ABNORMAL LOW (ref 6.5–8.1)

## 2021-02-19 LAB — TROPONIN I (HIGH SENSITIVITY): Troponin I (High Sensitivity): 4 ng/L (ref <18)

## 2021-02-19 LAB — CBC WITH DIFFERENTIAL/PLATELET
Abs Immature Granulocytes: 0.03 10*3/uL (ref 0.00–0.07)
Basophils Absolute: 0 10*3/uL (ref 0.0–0.1)
Basophils Relative: 1 %
Eosinophils Absolute: 0 10*3/uL (ref 0.0–0.5)
Eosinophils Relative: 1 %
HCT: 34 % — ABNORMAL LOW (ref 39.0–52.0)
Hemoglobin: 11.1 g/dL — ABNORMAL LOW (ref 13.0–17.0)
Immature Granulocytes: 1 %
Lymphocytes Relative: 15 %
Lymphs Abs: 0.9 10*3/uL (ref 0.7–4.0)
MCH: 32 pg (ref 26.0–34.0)
MCHC: 32.6 g/dL (ref 30.0–36.0)
MCV: 98 fL (ref 80.0–100.0)
Monocytes Absolute: 0.9 10*3/uL (ref 0.1–1.0)
Monocytes Relative: 15 %
Neutro Abs: 4.2 10*3/uL (ref 1.7–7.7)
Neutrophils Relative %: 67 %
Platelets: 170 10*3/uL (ref 150–400)
RBC: 3.47 MIL/uL — ABNORMAL LOW (ref 4.22–5.81)
RDW: 14.1 % (ref 11.5–15.5)
WBC: 6.1 10*3/uL (ref 4.0–10.5)
nRBC: 0 % (ref 0.0–0.2)

## 2021-02-19 LAB — CBG MONITORING, ED: Glucose-Capillary: 104 mg/dL — ABNORMAL HIGH (ref 70–99)

## 2021-02-19 LAB — BRAIN NATRIURETIC PEPTIDE: B Natriuretic Peptide: 72.1 pg/mL (ref 0.0–100.0)

## 2021-02-19 LAB — APTT: aPTT: 31 seconds (ref 24–36)

## 2021-02-19 LAB — PROTIME-INR
INR: 1 (ref 0.8–1.2)
Prothrombin Time: 13.4 seconds (ref 11.4–15.2)

## 2021-02-19 LAB — LACTIC ACID, PLASMA: Lactic Acid, Venous: 2.6 mmol/L (ref 0.5–1.9)

## 2021-02-19 LAB — AMMONIA: Ammonia: 10 umol/L (ref 9–35)

## 2021-02-19 MED ORDER — LACTATED RINGERS IV BOLUS (SEPSIS)
1000.0000 mL | Freq: Once | INTRAVENOUS | Status: AC
  Administered 2021-02-19: 1000 mL via INTRAVENOUS

## 2021-02-19 MED ORDER — SODIUM CHLORIDE 0.9 % IV SOLN
1.0000 g | Freq: Once | INTRAVENOUS | Status: AC
  Administered 2021-02-19: 1 g via INTRAVENOUS
  Filled 2021-02-19: qty 10

## 2021-02-19 MED ORDER — ACETAMINOPHEN 325 MG PO TABS
650.0000 mg | ORAL_TABLET | Freq: Once | ORAL | Status: AC
  Administered 2021-02-19: 650 mg via ORAL
  Filled 2021-02-19: qty 2

## 2021-02-19 MED ORDER — DIPHENHYDRAMINE HCL 25 MG PO CAPS
25.0000 mg | ORAL_CAPSULE | Freq: Once | ORAL | Status: AC
  Administered 2021-02-19: 25 mg via ORAL
  Filled 2021-02-19: qty 1

## 2021-02-19 NOTE — ED Provider Notes (Signed)
[facility] DEPT Provider Note   CSN: [id number] Arrival date & time: 02/19/21  2100     History Chief Complaint  Patient presents with   Altered Mental Status   Fever    BALTASAR VISALLI is a 87 y.o. male.   Altered Mental Status Associated symptoms: fever   Fever  87 year old male with a history of diabetes mellitus and dementia presenting from home with concern for worsening mental status and fever.  The patient arrived via EMS from home for possible urinary tract infection in the setting of increasing confusion and elevated temperature at home although wife states that his fever at home was a temperature of 99.  Level 5 caveat due to patient dementia.  Past Medical History:  Diagnosis Date   Dementia ([place])    Diabetes ([place])     Patient Active Problem List   Diagnosis Date Noted   Type II diabetes mellitus ([place]) 07/27/2013   Psoriasis and similar disorder 10/23/2012    Past Surgical History:  Procedure Laterality Date   CATARACT EXTRACTION, BILATERAL     TONSILLECTOMY AND ADENOIDECTOMY  1965       Family History  Problem Relation Age of Onset   Arthritis Other        parent   Lung cancer Other        parent   Heart disease Other        parent   Diabetes Other        parent   Brain cancer Mother        tumor   Heart disease Father    Anxiety disorder Sister    Heart disease Brother    Diabetes Brother    Mental illness Sister    Healthy Daughter     Social History   Tobacco Use   Smoking status: Never   Smokeless tobacco: Never  Vaping Use   Vaping Use: Never used  Substance Use Topics   Alcohol use: No    Home Medications Prior to Admission medications   Medication Sig Start Date End Date Taking? Authorizing Provider  donepezil (ARICEPT) 5 MG tablet Take 1 tablet (5 mg total) by mouth at bedtime. Patient not taking: Reported on 09/24/2020 03/20/19   Tat, Eustace Quail, DO  fluocinonide cream (LIDEX) 0.05 % Apply  to affected area 3 times a week as needed. 05/30/14   [provider]  glimepiride (AMARYL) 4 MG tablet Take 2 tablets by mouth twice a day.    [provider]  glucose blood (ONE TOUCH ULTRA TEST) test strip 1 each by Other route as needed. Use as instructed    [provider]  Lancets (ONETOUCH ULTRASOFT) lancets 1 each by Other route as needed. Use as instructed    [provider]  metFORMIN (GLUCOPHAGE) 1000 MG tablet Take 1,000 mg by mouth 2 (two) times daily. 02/27/20   [provider]  metFORMIN (GLUCOPHAGE) 500 MG tablet Take 1,000 mg by mouth 2 (two) times daily with a meal.     [provider]  Multiple Vitamin (MULTIVITAMIN) tablet Take 1 tablet by mouth daily.    [provider]  pioglitazone (ACTOS) 15 MG tablet TAKE 1 TABLET BY MOUTH ONCE DAILY FOR 90 DAYS 08/11/18   [provider]  pravastatin (PRAVACHOL) 20 MG tablet TAKE 1 TABLET BY MOUTH ONCE A WEEK FOR 90 DAYS 02/09/19   [provider]  vitamin B-12 (CYANOCOBALAMIN) 500 MCG tablet Take 500 mcg by mouth  daily.    [provider]    Allergies    Sitagliptin  Review of Systems   Review of Systems  Unable to perform ROS: Dementia  Constitutional:  Positive for fever.   Physical Exam Updated Vital Signs Ht '5\' 11"'$  (1.803 m)   Wt 72.5 kg   SpO2 97%   BMI 22.29 kg/m   Physical Exam Vitals and nursing note reviewed.  Constitutional:      Appearance: He is well-developed. He is not ill-appearing.  HENT:     Head: Normocephalic and atraumatic.  Eyes:     Conjunctiva/sclera: Conjunctivae normal.  Cardiovascular:     Rate and Rhythm: Normal rate and regular rhythm.     Heart sounds: No murmur heard. Pulmonary:     Effort: Pulmonary effort is normal. No respiratory distress.     Breath sounds: Normal breath sounds.  Abdominal:     Palpations: Abdomen is soft.     Tenderness: There is no abdominal tenderness.  Musculoskeletal:      Cervical back: Neck supple.  Skin:    General: Skin is warm and dry.  Neurological:     General: No focal deficit present.     Mental Status: He is alert. Mental status is at baseline. He is disoriented.    ED Results / Procedures / Treatments   Labs (all labs ordered are listed, but only abnormal results are displayed) Labs Reviewed - No data to display  EKG None  Radiology No results found.  Procedures Procedures   Medications Ordered in ED Medications - No data to display  ED Course  I have reviewed the triage vital signs and the nursing notes.  Pertinent labs & imaging results that were available during my care of the patient were reviewed by me and considered in my medical decision making (see chart for details).    MDM Rules/Calculators/A&P                           87 year old male presenting to the emergency department with concern for subjective fever at home and worsening mental status.  The patient has no other symptoms per his wife.  No significant cough or congestion.  No sick contacts.  On arrival, the patient was afebrile, he medically stable, saturating well on room air.  Altered mental status work-up on patient arrival was initiated to include screening labs, EKG, CT head.  Differential diagnosis is broad but includes infectious source to include pneumonia, viral URI, COVID-19, intra-abdominal infection, urinary tract infection, electrolyte abnormality, other toxic/metabolic derangement.  Majority the patient's work-up pending at time of signout.  Plan at signout to follow-up work-up and reassess the patient.  Signout given to Dr. Ralene Bathe at 2330. Final Clinical Impression(s) / ED Diagnoses Final diagnoses:  None    Rx / DC Orders ED Discharge Orders     None        Regan Lemming, MD 02/21/21 [phone]

## 2021-02-19 NOTE — ED Triage Notes (Signed)
Per EMS, pt coming from home for increased AMS, fever and poss UTI. Sxs started today according to his wife (who's his primary caregiver). Pt with hx of dementia and diabetes. Only takes metformin and tylenol.

## 2021-02-19 NOTE — ED Provider Notes (Signed)
Patient care assumed at 2300.  Pt with dementia coming from home for AMS and fever, concern for UTI.  UA pending.    Wife states that temperature was to 99 at home. UA is not consistent with UTI. Lactic acid is mildly elevated but no evidence of acute infectious process. Question some degree of dehydration contributing to this. Patient without any acute pain complaints, non-toxic appearing on evaluation. Wife is requesting discharge home. Discussed with life unclear source of symptoms. Plan to discharge home with outpatient follow-up and return precautions.   Quintella Reichert, MD 03/06/2021 [phone]

## 2021-02-20 ENCOUNTER — Emergency Department (HOSPITAL_COMMUNITY): Payer: Medicare Other

## 2021-02-20 ENCOUNTER — Inpatient Hospital Stay (HOSPITAL_COMMUNITY)
Admission: EM | Admit: 2021-02-20 | Discharge: 2021-03-12 | DRG: 177 | Disposition: E | Payer: Medicare Other | Attending: Internal Medicine | Admitting: Internal Medicine

## 2021-02-20 ENCOUNTER — Other Ambulatory Visit: Payer: Self-pay

## 2021-02-20 DIAGNOSIS — E11649 Type 2 diabetes mellitus with hypoglycemia without coma: Secondary | ICD-10-CM | POA: Diagnosis not present

## 2021-02-20 DIAGNOSIS — E162 Hypoglycemia, unspecified: Secondary | ICD-10-CM

## 2021-02-20 DIAGNOSIS — Z681 Body mass index (BMI) 19 or less, adult: Secondary | ICD-10-CM

## 2021-02-20 DIAGNOSIS — G9341 Metabolic encephalopathy: Secondary | ICD-10-CM | POA: Diagnosis present

## 2021-02-20 DIAGNOSIS — L89322 Pressure ulcer of left buttock, stage 2: Secondary | ICD-10-CM | POA: Diagnosis present

## 2021-02-20 DIAGNOSIS — R509 Fever, unspecified: Secondary | ICD-10-CM | POA: Diagnosis present

## 2021-02-20 DIAGNOSIS — C259 Malignant neoplasm of pancreas, unspecified: Secondary | ICD-10-CM | POA: Diagnosis present

## 2021-02-20 DIAGNOSIS — U071 COVID-19: Principal | ICD-10-CM | POA: Diagnosis present

## 2021-02-20 DIAGNOSIS — L8990 Pressure ulcer of unspecified site, unspecified stage: Secondary | ICD-10-CM | POA: Diagnosis present

## 2021-02-20 DIAGNOSIS — Z8249 Family history of ischemic heart disease and other diseases of the circulatory system: Secondary | ICD-10-CM

## 2021-02-20 DIAGNOSIS — Z4659 Encounter for fitting and adjustment of other gastrointestinal appliance and device: Secondary | ICD-10-CM

## 2021-02-20 DIAGNOSIS — Z833 Family history of diabetes mellitus: Secondary | ICD-10-CM

## 2021-02-20 DIAGNOSIS — F0390 Unspecified dementia without behavioral disturbance: Secondary | ICD-10-CM | POA: Diagnosis present

## 2021-02-20 DIAGNOSIS — K8689 Other specified diseases of pancreas: Secondary | ICD-10-CM | POA: Diagnosis present

## 2021-02-20 DIAGNOSIS — R262 Difficulty in walking, not elsewhere classified: Secondary | ICD-10-CM | POA: Diagnosis not present

## 2021-02-20 DIAGNOSIS — J1282 Pneumonia due to coronavirus disease 2019: Secondary | ICD-10-CM | POA: Diagnosis present

## 2021-02-20 DIAGNOSIS — E860 Dehydration: Secondary | ICD-10-CM | POA: Diagnosis present

## 2021-02-20 DIAGNOSIS — R627 Adult failure to thrive: Secondary | ICD-10-CM | POA: Diagnosis present

## 2021-02-20 DIAGNOSIS — Z515 Encounter for palliative care: Secondary | ICD-10-CM

## 2021-02-20 DIAGNOSIS — Z7984 Long term (current) use of oral hypoglycemic drugs: Secondary | ICD-10-CM

## 2021-02-20 DIAGNOSIS — Z7189 Other specified counseling: Secondary | ICD-10-CM

## 2021-02-20 DIAGNOSIS — Z789 Other specified health status: Secondary | ICD-10-CM

## 2021-02-20 DIAGNOSIS — I700 Atherosclerosis of aorta: Secondary | ICD-10-CM | POA: Diagnosis not present

## 2021-02-20 DIAGNOSIS — E1165 Type 2 diabetes mellitus with hyperglycemia: Secondary | ICD-10-CM | POA: Diagnosis not present

## 2021-02-20 DIAGNOSIS — Z801 Family history of malignant neoplasm of trachea, bronchus and lung: Secondary | ICD-10-CM

## 2021-02-20 DIAGNOSIS — R5381 Other malaise: Secondary | ICD-10-CM | POA: Diagnosis not present

## 2021-02-20 DIAGNOSIS — Z808 Family history of malignant neoplasm of other organs or systems: Secondary | ICD-10-CM

## 2021-02-20 DIAGNOSIS — Z66 Do not resuscitate: Secondary | ICD-10-CM | POA: Diagnosis present

## 2021-02-20 DIAGNOSIS — E871 Hypo-osmolality and hyponatremia: Secondary | ICD-10-CM | POA: Diagnosis present

## 2021-02-20 DIAGNOSIS — G9340 Encephalopathy, unspecified: Secondary | ICD-10-CM | POA: Diagnosis present

## 2021-02-20 DIAGNOSIS — R531 Weakness: Secondary | ICD-10-CM

## 2021-02-20 DIAGNOSIS — E876 Hypokalemia: Secondary | ICD-10-CM | POA: Diagnosis present

## 2021-02-20 DIAGNOSIS — R633 Feeding difficulties, unspecified: Secondary | ICD-10-CM

## 2021-02-20 DIAGNOSIS — J918 Pleural effusion in other conditions classified elsewhere: Secondary | ICD-10-CM | POA: Diagnosis present

## 2021-02-20 DIAGNOSIS — R54 Age-related physical debility: Secondary | ICD-10-CM | POA: Diagnosis present

## 2021-02-20 DIAGNOSIS — Z743 Need for continuous supervision: Secondary | ICD-10-CM | POA: Diagnosis not present

## 2021-02-20 DIAGNOSIS — Z79899 Other long term (current) drug therapy: Secondary | ICD-10-CM

## 2021-02-20 DIAGNOSIS — R404 Transient alteration of awareness: Secondary | ICD-10-CM | POA: Diagnosis not present

## 2021-02-20 DIAGNOSIS — R1310 Dysphagia, unspecified: Secondary | ICD-10-CM

## 2021-02-20 LAB — URINALYSIS, ROUTINE W REFLEX MICROSCOPIC
Bilirubin Urine: NEGATIVE
Glucose, UA: NEGATIVE mg/dL
Ketones, ur: 5 mg/dL — AB
Leukocytes,Ua: NEGATIVE
Nitrite: NEGATIVE
Protein, ur: NEGATIVE mg/dL
Specific Gravity, Urine: 1.021 (ref 1.005–1.030)
pH: 5 (ref 5.0–8.0)

## 2021-02-20 LAB — CBC WITH DIFFERENTIAL/PLATELET
Abs Immature Granulocytes: 0.03 10*3/uL (ref 0.00–0.07)
Basophils Absolute: 0 10*3/uL (ref 0.0–0.1)
Basophils Relative: 0 %
Eosinophils Absolute: 0 10*3/uL (ref 0.0–0.5)
Eosinophils Relative: 0 %
HCT: 36.1 % — ABNORMAL LOW (ref 39.0–52.0)
Hemoglobin: 11.7 g/dL — ABNORMAL LOW (ref 13.0–17.0)
Immature Granulocytes: 1 %
Lymphocytes Relative: 19 %
Lymphs Abs: 1.1 10*3/uL (ref 0.7–4.0)
MCH: 31.4 pg (ref 26.0–34.0)
MCHC: 32.4 g/dL (ref 30.0–36.0)
MCV: 96.8 fL (ref 80.0–100.0)
Monocytes Absolute: 1 10*3/uL (ref 0.1–1.0)
Monocytes Relative: 18 %
Neutro Abs: 3.7 10*3/uL (ref 1.7–7.7)
Neutrophils Relative %: 62 %
Platelets: 162 10*3/uL (ref 150–400)
RBC: 3.73 MIL/uL — ABNORMAL LOW (ref 4.22–5.81)
RDW: 13.8 % (ref 11.5–15.5)
WBC: 5.9 10*3/uL (ref 4.0–10.5)
nRBC: 0 % (ref 0.0–0.2)

## 2021-02-20 LAB — RESP PANEL BY RT-PCR (FLU A&B, COVID) ARPGX2
Influenza A by PCR: NEGATIVE
Influenza B by PCR: NEGATIVE
SARS Coronavirus 2 by RT PCR: POSITIVE — AB

## 2021-02-20 NOTE — ED Triage Notes (Signed)
Pt BIB EMS from home. Wife reports fever and malaise x 2 days. Pt was seen yesterday for same. Pt has no complaints. Pt tested covid + yesterday. A&ox3 (baseline), hx of dementia.

## 2021-02-20 NOTE — ED Provider Notes (Signed)
[facility] DEPT Provider Note   CSN: [id number] Arrival date & time: 02/21/2021  2155     History Chief Complaint  Patient presents with   Fever   Fatigue   Covid Positive    Kyser B Rodkey is a 87 y.o. male with a history of dementia and diabetes mellitus type 2 who presents to the emergency department by EMS from home with a chief complaint of weakness.  EMS reports that the patient's wife reported malaise and fever for the last 2 days.  The history is provided by the patient's wife.  She is the patient's primary caregiver at home.  She reports that yesterday evening, that the patient rapidly developed weakness and fatigue, which prompted her to call EMS and sent him to the ED for evaluation.  She reports that he was discharged at 2:30 AM.  When they arrived home, he required the assistance of family members to get him out of the car and into the house.  At baseline, he ambulates independently.  She reports that he has been extremely weak and has not gotten out of bed all day.  She reports associated anorexia, but finally ate a meal around 1400 when he took his home antihyperglycemic medications.  Due to his worsening symptoms, she is unable to care for him at home.  She does report that she has been attempting to secure in-home health care for some time, but was told that there was a 2-year waiting.  Even before his symptoms worsen yesterday, she did have concerns about her ability to provide care for him at home.  The patient's wife is adamant that his dementia and inability to care for himself is significantly worsened after he received his COVID vaccinations x2 sometime ago.  She denies any worsening shortness of breath, vomiting, or falls.  No known sick contacts.  On my evaluation, the patient has no complaints.   Level 5 caveat secondary to dementia.   The history is provided by the patient and medical records. No language interpreter was used.       Past Medical History:  Diagnosis Date   Dementia ([place])    Diabetes ([place])     Patient Active Problem List   Diagnosis Date Noted   Acute COVID-19 02/21/2021   Dementia ([place])    Acute encephalopathy    Type 2 diabetes mellitus with hypoglycemia without coma ([place]) 07/27/2013   Psoriasis and similar disorder 10/23/2012    Past Surgical History:  Procedure Laterality Date   CATARACT EXTRACTION, BILATERAL     TONSILLECTOMY AND ADENOIDECTOMY  1965       Family History  Problem Relation Age of Onset   Arthritis Other        parent   Lung cancer Other        parent   Heart disease Other        parent   Diabetes Other        parent   Brain cancer Mother        tumor   Heart disease Father    Anxiety disorder Sister    Heart disease Brother    Diabetes Brother    Mental illness Sister    Healthy Daughter     Social History   Tobacco Use   Smoking status: Never   Smokeless tobacco: Never  Vaping Use   Vaping Use: Never used  Substance Use Topics   Alcohol use: No    Home Medications Prior to  Admission medications   Medication Sig Start Date End Date Taking? Authorizing Provider  acetaminophen (TYLENOL) 500 MG tablet Take 500 mg by mouth every 6 (six) hours as needed for mild pain, moderate pain, fever or headache.   Yes [provider]  glimepiride (AMARYL) 4 MG tablet Take 4 mg by mouth daily with breakfast.   Yes [provider]  metFORMIN (GLUCOPHAGE) 1000 MG tablet Take 1,000 mg by mouth 2 (two) times daily. 02/27/20  Yes [provider]  vitamin B-12 (CYANOCOBALAMIN) 500 MCG tablet Take 500 mcg by mouth daily.   Yes [provider]    Allergies    Sitagliptin  Review of Systems   Review of Systems  Unable to perform ROS: Mental status change   Physical Exam Updated Vital Signs BP (!) 171/76   Pulse 82   Temp 98.8 F (37.1 C)   Resp 17   SpO2 97%   Physical Exam Vitals and nursing note reviewed.   Constitutional:      Appearance: Normal appearance. He is well-developed.     Comments: Elderly male.  No acute distress.  HENT:     Head: Normocephalic.  Eyes:     Conjunctiva/sclera: Conjunctivae normal.  Cardiovascular:     Rate and Rhythm: Normal rate and regular rhythm.     Pulses: Normal pulses.     Heart sounds: Normal heart sounds. No murmur heard.   No friction rub. No gallop.  Pulmonary:     Effort: Pulmonary effort is normal. No respiratory distress.     Breath sounds: No stridor. No wheezing, rhonchi or rales.  Chest:     Chest wall: No tenderness.  Abdominal:     General: There is no distension.     Palpations: Abdomen is soft. There is no mass.     Tenderness: There is no abdominal tenderness. There is no right CVA tenderness, left CVA tenderness, guarding or rebound.     Hernia: No hernia is present.  Musculoskeletal:     Cervical back: Neck supple.     Right lower leg: No edema.     Left lower leg: No edema.  Skin:    General: Skin is warm and dry.     Coloration: Skin is pale.  Neurological:     Mental Status: He is alert.     Comments: Oriented to self only.  Answers questions appropriately intermittently.  At times, he will not answer questions.  However, answers, when provided, are appropriate.  Psychiatric:        Behavior: Behavior normal.    ED Results / Procedures / Treatments   Labs (all labs ordered are listed, but only abnormal results are displayed) Labs Reviewed  CBC WITH DIFFERENTIAL/PLATELET - Abnormal; Notable for the following components:      Result Value   RBC 3.73 (*)    Hemoglobin 11.7 (*)    HCT 36.1 (*)    All other components within normal limits  COMPREHENSIVE METABOLIC PANEL - Abnormal; Notable for the following components:   Glucose, Bld 63 (*)    Calcium 8.2 (*)    Total Protein 5.9 (*)    Albumin 3.2 (*)    All other components within normal limits  LACTIC ACID, PLASMA  HEMOGLOBIN A1C  PROCALCITONIN  CBG MONITORING,  ED    EKG None  Radiology CT HEAD WO CONTRAST (5MM)  Result Date: 02/19/2021 CLINICAL DATA:  Mental status change EXAM: CT HEAD WITHOUT CONTRAST TECHNIQUE: Contiguous axial images were obtained  from the base of the skull through the vertex without intravenous contrast. COMPARISON:  06/12/2020 CT FINDINGS: Brain: No acute territorial infarction, hemorrhage or new intracranial mass. 7 mm extra-axial mass along the left vertex best seen on coronal views, series 4, image 33 probably a small noncalcified meningioma, this is without change. Atrophy and mild chronic small vessel ischemic changes of the white matter. Stable ventricle size. Vascular: No hyperdense fluid collection. Carotid vascular calcification Skull: Normal. Negative for fracture or focal lesion. Sinuses/Orbits: No acute finding. Other: None IMPRESSION: 1. No CT evidence for acute intracranial abnormality. 2. Atrophy and chronic small vessel ischemic changes of the white matter. Electronically Signed   By: Donavan Foil M.D.   On: 02/19/2021 22:55   DG Chest Portable 1 View  Result Date: 02/15/2021 CLINICAL DATA:  COVID positive with fever and malaise x2 days. EXAM: PORTABLE CHEST 1 VIEW COMPARISON:  February 19, 2021 FINDINGS: Very mild atelectasis and/or early infiltrate is seen within the right lung base. There is no evidence of a pleural effusion or pneumothorax. The heart size and mediastinal contours are within normal limits. Mild to moderate severity calcification of the aortic arch is noted. The visualized skeletal structures are unremarkable. IMPRESSION: Very mild right basilar atelectasis and/or early infiltrate. Electronically Signed   By: Virgina Norfolk M.D.   On: 02/14/2021 22:32   DG Chest Port 1 View  Result Date: 02/19/2021 CLINICAL DATA:  07/30/2016 EXAM: PORTABLE CHEST 1 VIEW COMPARISON:  None. FINDINGS: Heart and mediastinal contours are within normal limits. No focal opacities or effusions. No acute bony abnormality.  Aortic atherosclerosis. IMPRESSION: No active cardiopulmonary disease. Electronically Signed   By: Rolm Baptise M.D.   On: 02/19/2021 22:13    Procedures Procedures   Medications Ordered in ED Medications  insulin aspart (novoLOG) injection 0-6 Units (0 Units Subcutaneous Not Given 02/21/21 0149)  enoxaparin (LOVENOX) injection 40 mg (has no administration in time range)  guaiFENesin-dextromethorphan (ROBITUSSIN DM) 100-10 MG/5ML syrup 10 mL (has no administration in time range)  acetaminophen (TYLENOL) tablet 650 mg (has no administration in time range)  ondansetron (ZOFRAN) tablet 4 mg (has no administration in time range)    Or  ondansetron (ZOFRAN) injection 4 mg (has no administration in time range)  senna-docusate (Senokot-S) tablet 1 tablet (has no administration in time range)  bisacodyl (DULCOLAX) EC tablet 5 mg (has no administration in time range)  remdesivir 100 mg in sodium chloride 0.9 % 100 mL IVPB (0 mg Intravenous Stopped 02/21/21 0245)    Followed by  remdesivir 100 mg in sodium chloride 0.9 % 100 mL IVPB (100 mg Intravenous New Bag/Given (Non-Interop) 02/21/21 0253)    Followed by  remdesivir 100 mg in sodium chloride 0.9 % 100 mL IVPB (has no administration in time range)    ED Course  I have reviewed the triage vital signs and the nursing notes.  Pertinent labs & imaging results that were available during my care of the patient were reviewed by me and considered in my medical decision making (see chart for details).    MDM Rules/Calculators/A&P                           87 year old male with a history of dementia and diabetes mellitus type 2 who presents the emergency department by EMS from home with generalized weakness and fatigue, onset yesterday.  Patient was seen in the ED yesterday for the same, but was discharged home.  He had a positive COVID-19 test that resulted after he was discharged.  Today, weakness is worsened and he has been unable to  ambulate.  Vital signs are stable.  No hypoxia.  Labs and imaging have been reviewed and independently interpreted by me.  He had a positive COVID-19 test yesterday in the ED.  Repeat chest x-ray today with developing infiltrate on the right, likely viral pneumonia secondary to COVID-19.  However, patient has no increased work of breathing and lungs are clear to auscultation bilaterally on exam.  Glucose is 63.  Patient was given juice and applesauce. Yesterday, the patient had a minimally elevated lactate, but lactate is normal today.  Labs are otherwise unremarkable.  Given patient's profound weakness and inability to ambulate, he is not safe for disposition to home.  He will require admission for further work-up and evaluation.  The patient was discussed with Dr. Christy Gentles, attending physician.  Consult to the hospitalist team and Dr. Myna Hidalgo will accept the patient for admission  The patient appears reasonably stabilized for admission considering the current resources, flow, and capabilities available in the ED at this time, and I doubt any other [facility] requiring further screening and/or treatment in the ED prior to admission.     Final Clinical Impression(s) / ED Diagnoses Final diagnoses:  COVID-19  Hypoglycemia  Weakness  Unable to ambulate    Rx / DC Orders ED Discharge Orders     None        Joanne Gavel, PA-C 02/21/21 0308    Ripley Fraise, MD 02/21/21 [phone]

## 2021-02-21 ENCOUNTER — Encounter (HOSPITAL_COMMUNITY): Payer: Self-pay | Admitting: Family Medicine

## 2021-02-21 DIAGNOSIS — E860 Dehydration: Secondary | ICD-10-CM | POA: Diagnosis present

## 2021-02-21 DIAGNOSIS — Z801 Family history of malignant neoplasm of trachea, bronchus and lung: Secondary | ICD-10-CM | POA: Diagnosis not present

## 2021-02-21 DIAGNOSIS — E871 Hypo-osmolality and hyponatremia: Secondary | ICD-10-CM | POA: Diagnosis present

## 2021-02-21 DIAGNOSIS — F0390 Unspecified dementia without behavioral disturbance: Secondary | ICD-10-CM

## 2021-02-21 DIAGNOSIS — R509 Fever, unspecified: Secondary | ICD-10-CM | POA: Diagnosis not present

## 2021-02-21 DIAGNOSIS — Z79899 Other long term (current) drug therapy: Secondary | ICD-10-CM | POA: Diagnosis not present

## 2021-02-21 DIAGNOSIS — R1310 Dysphagia, unspecified: Secondary | ICD-10-CM | POA: Diagnosis not present

## 2021-02-21 DIAGNOSIS — J90 Pleural effusion, not elsewhere classified: Secondary | ICD-10-CM | POA: Diagnosis not present

## 2021-02-21 DIAGNOSIS — G459 Transient cerebral ischemic attack, unspecified: Secondary | ICD-10-CM | POA: Diagnosis not present

## 2021-02-21 DIAGNOSIS — G9341 Metabolic encephalopathy: Secondary | ICD-10-CM | POA: Diagnosis present

## 2021-02-21 DIAGNOSIS — Z808 Family history of malignant neoplasm of other organs or systems: Secondary | ICD-10-CM | POA: Diagnosis not present

## 2021-02-21 DIAGNOSIS — E876 Hypokalemia: Secondary | ICD-10-CM | POA: Diagnosis not present

## 2021-02-21 DIAGNOSIS — Z7984 Long term (current) use of oral hypoglycemic drugs: Secondary | ICD-10-CM | POA: Diagnosis not present

## 2021-02-21 DIAGNOSIS — U071 COVID-19: Principal | ICD-10-CM

## 2021-02-21 DIAGNOSIS — E11649 Type 2 diabetes mellitus with hypoglycemia without coma: Secondary | ICD-10-CM

## 2021-02-21 DIAGNOSIS — C259 Malignant neoplasm of pancreas, unspecified: Secondary | ICD-10-CM | POA: Diagnosis present

## 2021-02-21 DIAGNOSIS — Z7189 Other specified counseling: Secondary | ICD-10-CM | POA: Diagnosis not present

## 2021-02-21 DIAGNOSIS — R54 Age-related physical debility: Secondary | ICD-10-CM | POA: Diagnosis present

## 2021-02-21 DIAGNOSIS — Z833 Family history of diabetes mellitus: Secondary | ICD-10-CM | POA: Diagnosis not present

## 2021-02-21 DIAGNOSIS — R531 Weakness: Secondary | ICD-10-CM | POA: Diagnosis present

## 2021-02-21 DIAGNOSIS — Z681 Body mass index (BMI) 19 or less, adult: Secondary | ICD-10-CM | POA: Diagnosis not present

## 2021-02-21 DIAGNOSIS — Z515 Encounter for palliative care: Secondary | ICD-10-CM | POA: Diagnosis not present

## 2021-02-21 DIAGNOSIS — R627 Adult failure to thrive: Secondary | ICD-10-CM | POA: Diagnosis present

## 2021-02-21 DIAGNOSIS — Z8249 Family history of ischemic heart disease and other diseases of the circulatory system: Secondary | ICD-10-CM | POA: Diagnosis not present

## 2021-02-21 DIAGNOSIS — E1165 Type 2 diabetes mellitus with hyperglycemia: Secondary | ICD-10-CM | POA: Diagnosis not present

## 2021-02-21 DIAGNOSIS — L89322 Pressure ulcer of left buttock, stage 2: Secondary | ICD-10-CM | POA: Diagnosis present

## 2021-02-21 DIAGNOSIS — R633 Feeding difficulties, unspecified: Secondary | ICD-10-CM | POA: Diagnosis not present

## 2021-02-21 DIAGNOSIS — Z4682 Encounter for fitting and adjustment of non-vascular catheter: Secondary | ICD-10-CM | POA: Diagnosis not present

## 2021-02-21 DIAGNOSIS — K8689 Other specified diseases of pancreas: Secondary | ICD-10-CM | POA: Diagnosis not present

## 2021-02-21 DIAGNOSIS — G9340 Encephalopathy, unspecified: Secondary | ICD-10-CM | POA: Diagnosis not present

## 2021-02-21 DIAGNOSIS — J918 Pleural effusion in other conditions classified elsewhere: Secondary | ICD-10-CM | POA: Diagnosis present

## 2021-02-21 DIAGNOSIS — J1282 Pneumonia due to coronavirus disease 2019: Secondary | ICD-10-CM | POA: Diagnosis present

## 2021-02-21 DIAGNOSIS — R109 Unspecified abdominal pain: Secondary | ICD-10-CM | POA: Diagnosis not present

## 2021-02-21 DIAGNOSIS — Z66 Do not resuscitate: Secondary | ICD-10-CM | POA: Diagnosis present

## 2021-02-21 LAB — CBG MONITORING, ED
Glucose-Capillary: 78 mg/dL (ref 70–99)
Glucose-Capillary: 97 mg/dL (ref 70–99)
Glucose-Capillary: 84 mg/dL (ref 70–99)

## 2021-02-21 LAB — COMPREHENSIVE METABOLIC PANEL
ALT: 17 U/L (ref 0–44)
AST: 26 U/L (ref 15–41)
Albumin: 3.2 g/dL — ABNORMAL LOW (ref 3.5–5.0)
Alkaline Phosphatase: 49 U/L (ref 38–126)
Anion gap: 10 (ref 5–15)
BUN: 12 mg/dL (ref 8–23)
CO2: 26 mmol/L (ref 22–32)
Calcium: 8.2 mg/dL — ABNORMAL LOW (ref 8.9–10.3)
Chloride: 100 mmol/L (ref 98–111)
Creatinine, Ser: 0.69 mg/dL (ref 0.61–1.24)
GFR, Estimated: >60 mL/min (ref >60)
Glucose, Bld: 63 mg/dL — ABNORMAL LOW (ref 70–99)
Potassium: 3.5 mmol/L (ref 3.5–5.1)
Sodium: 136 mmol/L (ref 135–145)
Total Bilirubin: 0.5 mg/dL (ref 0.3–1.2)
Total Protein: 5.9 g/dL — ABNORMAL LOW (ref 6.5–8.1)

## 2021-02-21 LAB — URINE CULTURE

## 2021-02-21 LAB — HEMOGLOBIN A1C
Hgb A1c MFr Bld: 7.3 % — ABNORMAL HIGH (ref 4.8–5.6)
Mean Plasma Glucose: 162.81 mg/dL

## 2021-02-21 LAB — PROCALCITONIN: Procalcitonin: <0.1 ng/mL

## 2021-02-21 LAB — LACTIC ACID, PLASMA: Lactic Acid, Venous: 1.5 mmol/L (ref 0.5–1.9)

## 2021-02-21 LAB — GLUCOSE, CAPILLARY
Glucose-Capillary: 73 mg/dL (ref 70–99)
Glucose-Capillary: 109 mg/dL — ABNORMAL HIGH (ref 70–99)

## 2021-02-21 MED ORDER — ONDANSETRON HCL 4 MG/2ML IJ SOLN: 4.0000 mg | Freq: Four times a day (QID) | INTRAMUSCULAR | Status: DC | PRN

## 2021-02-21 MED ORDER — SODIUM CHLORIDE 0.9 % IV SOLN
100.0000 mg | Freq: Every day | INTRAVENOUS | Status: AC
  Administered 2021-02-22 – 2021-02-25 (×4): 100 mg via INTRAVENOUS
  Filled 2021-02-21 (×4): qty 20

## 2021-02-21 MED ORDER — SODIUM CHLORIDE 0.9 % IV SOLN
100.0000 mg | Freq: Once | INTRAVENOUS | Status: AC
  Administered 2021-02-21: 100 mg via INTRAVENOUS

## 2021-02-21 MED ORDER — ORAL CARE MOUTH RINSE
15.0000 mL | Freq: Two times a day (BID) | OROMUCOSAL | Status: DC
  Administered 2021-02-22 – 2021-03-03 (×19): 15 mL via OROMUCOSAL

## 2021-02-21 MED ORDER — ACETAMINOPHEN 325 MG PO TABS
650.0000 mg | ORAL_TABLET | Freq: Four times a day (QID) | ORAL | Status: DC | PRN
  Administered 2021-02-24: 650 mg via ORAL
  Filled 2021-02-21: qty 2

## 2021-02-21 MED ORDER — SENNOSIDES-DOCUSATE SODIUM 8.6-50 MG PO TABS: 1.0000 | ORAL_TABLET | Freq: Every evening | ORAL | Status: DC | PRN

## 2021-02-21 MED ORDER — SODIUM CHLORIDE 0.9 % IV SOLN: 100.0000 mg | Freq: Every day | INTRAVENOUS | Status: DC

## 2021-02-21 MED ORDER — ONDANSETRON HCL 4 MG PO TABS: 4.0000 mg | ORAL_TABLET | Freq: Four times a day (QID) | ORAL | Status: DC | PRN

## 2021-02-21 MED ORDER — BISACODYL 5 MG PO TBEC: 5.0000 mg | DELAYED_RELEASE_TABLET | Freq: Every day | ORAL | Status: DC | PRN

## 2021-02-21 MED ORDER — CHLORHEXIDINE GLUCONATE 0.12 % MT SOLN
15.0000 mL | Freq: Two times a day (BID) | OROMUCOSAL | Status: DC
  Administered 2021-02-21 – 2021-03-03 (×18): 15 mL via OROMUCOSAL
  Filled 2021-02-21 (×18): qty 15

## 2021-02-21 MED ORDER — ENOXAPARIN SODIUM 40 MG/0.4ML IJ SOSY
40.0000 mg | PREFILLED_SYRINGE | INTRAMUSCULAR | Status: DC
  Administered 2021-02-22 – 2021-03-03 (×10): 40 mg via SUBCUTANEOUS
  Filled 2021-02-21 (×10): qty 0.4

## 2021-02-21 MED ORDER — DEXTROSE-NACL 5-0.9 % IV SOLN: INTRAVENOUS | Status: DC

## 2021-02-21 MED ORDER — SODIUM CHLORIDE 0.9 % IV SOLN: 200.0000 mg | Freq: Once | INTRAVENOUS | Status: DC

## 2021-02-21 MED ORDER — SODIUM CHLORIDE 0.9 % IV SOLN
100.0000 mg | Freq: Once | INTRAVENOUS | Status: AC
  Administered 2021-02-21: 100 mg via INTRAVENOUS
  Filled 2021-02-21: qty 20

## 2021-02-21 MED ORDER — SODIUM CHLORIDE 0.9 % IV SOLN: INTRAVENOUS | Status: DC

## 2021-02-21 MED ORDER — GUAIFENESIN-DM 100-10 MG/5ML PO SYRP: 10.0000 mL | ORAL_SOLUTION | ORAL | Status: DC | PRN

## 2021-02-21 MED ORDER — INSULIN ASPART 100 UNIT/ML IJ SOLN
0.0000 [IU] | INTRAMUSCULAR | Status: DC
  Administered 2021-02-22 – 2021-02-24 (×5): 1 [IU] via SUBCUTANEOUS
  Administered 2021-02-24: 2 [IU] via SUBCUTANEOUS
  Filled 2021-02-21: qty 0.06

## 2021-02-21 NOTE — Plan of Care (Signed)
  Problem: Education: Goal: Knowledge of General Education information will improve Description: Including pain rating scale, medication(s)/side effects and non-pharmacologic comfort measures Outcome: Progressing   Problem: Health Behavior/Discharge Planning: Goal: Ability to manage health-related needs will improve Outcome: Progressing   Problem: Activity: Goal: Risk for activity intolerance will decrease Outcome: Progressing   Problem: Nutrition: Goal: Adequate nutrition will be maintained Outcome: Progressing   Problem: Safety: Goal: Ability to remain free from injury will improve Outcome: Progressing   Problem: Skin Integrity: Goal: Risk for impaired skin integrity will decrease Outcome: Progressing   Problem: Education: Goal: Knowledge of risk factors and measures for prevention of condition will improve Outcome: Progressing   Problem: Coping: Goal: Psychosocial and spiritual needs will be supported Outcome: Progressing   Problem: Respiratory: Goal: Will maintain a patent airway Outcome: Progressing Goal: Complications related to the disease process, condition or treatment will be avoided or minimized Outcome: Progressing

## 2021-02-21 NOTE — Progress Notes (Signed)
Update given to wife [name] over the phone.

## 2021-02-21 NOTE — ED Notes (Signed)
Pt repositioned in bed. Denies complaint. Pt updated on room.

## 2021-02-21 NOTE — ED Notes (Signed)
Pt resting in stretcher. Pt repositioned in bed, turned to right side.

## 2021-02-21 NOTE — TOC Progression Note (Addendum)
Transition of Care [facility]) - Progression Note    Patient Details  Name: Stanley Gilbert MRN: [id number] Date of Birth: Nov 28, 1930  Transition of Care [facility]) CM/SW Van, RN Phone Number: 02/21/2021, 2:07 PM  Clinical Narrative:     [facility] secured for PT, OT, Aaide and CSW, [facility] accepted, will message MD for orders when appropriate and speak to wife regarding acceptance Had a talk with wife Stanley Gilbert  via telephone about patient needs. She stated that last time PT came in they did nothing but check his blood pressure, She does not know if he needs that or not. PT evaluation ordered for recommendations. Patient has fallen 11 times in the last few months. She is primary caregiver, with family as secondary. She has applied for medicaid, to get PCS services which is her big concern and need. She reiterated again that she promised he would never go in a nursing home.   Expected Discharge Plan: [place] Barriers to Discharge: Continued Medical Work up  Expected Discharge Plan and Services Expected Discharge Plan: [place]   Discharge Planning Services: CM Consult   Living arrangements for the past 2 months: Assisted Living Facility                           HH Arranged: OT, PT, Nurse's Aide, Refused SNF, Social Work [employer] Agency: [place] Date [place]: 02/21/21 Time [place]: 1200 Representative spoke with at [place]: [facility] Interventions    Readmission Risk Interventions No flowsheet data found.

## 2021-02-21 NOTE — TOC Initial Note (Signed)
Transition of Care [facility]) - Initial/Assessment Note    Patient Details  Name: Stanley Gilbert MRN: [id number] Date of Birth: 1930/12/28  Transition of Care [facility]) CM/SW Contact:    Verdell Carmine, RN Phone Number: 02/21/2021, 10:08 AM  Clinical Narrative:                 87 yo patient admitted with COVID weakness. Lives with wife at home. Wife stating to MD she does not want SNF, but would like home Health and in fact has been trying to obtain this. Will likely need PT, OT, aide and social work.  Reached out to [facility] for acceptance  Expected Discharge Plan: [place] Barriers to Discharge: Continued Medical Work up   Patient Goals and CMS Choice        Expected Discharge Plan and Services Expected Discharge Plan: [place]   Discharge Planning Services: CM Consult   Living arrangements for the past 2 months: Assisted Living Facility                           HH Arranged: OT, PT, Nurse's Aide, Refused SNF, Social Work          Prior Living Arrangements/Services Living arrangements for the past 2 months: [place] Lives with:: Spouse Patient language and need for interpreter reviewed:: Yes        Need for Family Participation in Patient Care: Yes (Comment) Care giver support system in place?: Yes (comment)   Criminal Activity/Legal Involvement Pertinent to Current Situation/Hospitalization: No - Comment as needed  Activities of Daily Living      Permission Sought/Granted                  Emotional Assessment       Orientation: : Fluctuating Orientation (Suspected and/or reported [place]) Alcohol / Substance Use: Not Applicable Psych Involvement: No (comment)  Admission diagnosis:  Acute COVID-19 [U07.1] Patient Active Problem List   Diagnosis Date Noted   Acute COVID-19 02/21/2021   Dementia ([place])    Acute encephalopathy    Type 2 diabetes mellitus with hypoglycemia without coma  ([place]) 07/27/2013   Psoriasis and similar disorder 10/23/2012   PCP:  [name], MD Pharmacy:   [facility] [phone] [place], [place] [address] Phone: [phone] Fax: [facility] [address], [place] - [place] [address] [place] [address] Phone: [phone] Fax: [phone]  [place], [place] [address] Phone: [phone] Fax: [phone]     Social Determinants of Health (SDOH) Interventions    Readmission Risk Interventions No flowsheet data found.

## 2021-02-21 NOTE — Progress Notes (Signed)
Patient was admitted by my partner this morning.  Briefly 87 year old male with dementia type 2 diabetes admitted for fever failure to thrive increased confusion than his baseline fatigue and weakness.  He was brought to the ED on 02/19/2021 with similar complaints he had CT head urine analysis and a positive COVID-19 PCR he was sent home after IV fluids.  Wife reported that she was unable to get him out of bed despite assisting him and he was not eating or drinking and had decreased urine output.  He is normally oriented to person place able to do all ADLs by himself but he needs assistance with all the ADLs at this time.  He is more confused than his baseline. He is admitted with acute encephalopathy increased confusion generalized weakness secondary to COVID he was also found to be hypoglycemic with a blood sugar of 63 on glimepiride at home with decreased p.o. intake. Staff reported that they try to feed him but he did not make any effort to chew his food. He also had difficulty with chewing food this morning consulted speech therapy.

## 2021-02-21 NOTE — ED Notes (Signed)
Pt resting in stretcher. Pt turned to left side.

## 2021-02-21 NOTE — ED Notes (Addendum)
Pt given apple sauce and orange juice for low glucose level. Assisted pt with eating and drinking. No issues observed.

## 2021-02-21 NOTE — H&P (Signed)
History and Physical    Stanley Gilbert [id number] DOB: October 29, 1930 DOA: 03/01/2021  PCP: [name], MD   Patient coming from: Home   Chief Complaint: Fatigue/weakness, not eating, fever, increased confusion   HPI: Stanley Gilbert is a 87 y.o. male with medical history significant for dementia and type 2 diabetes mellitus, now presenting to the emergency department for evaluation of fatigue, general weakness, anorexia, increased confusion, and fevers.  History is mainly obtained from the patient's wife.  Patient lives with his wife who prepares his meals but notes that he is normally able to feed himself, ambulate unassisted, and use the bathroom on his own.  Beginning 2 days ago, she noted the patient to be more confused than usual, severely fatigued, and having some low-grade fevers.  He was brought into the ED for evaluation of this on 02/19/2021 where he had a head CT negative for acute findings, urinalysis not suggestive of infection, and positive COVID-19 PCR.  He returned home from the emergency department but has continued to grow more fatigued and generally weak to the point where he cannot get out of bed despite his wife attempting to assist him.  She has been having trouble getting him to eat anything for the past 2 days but eventually got him to have some ice cream last night.  He is normally oriented to person and place only but has been more confused for the past 2 days.   Patient's wife notes that she has been struggling to arrange home health for her husband, was encouraged to consider having him placed in a nursing facility, but is adamant that she will not do that.  ED Course: Upon arrival to the ED, patient is found to be afebrile, saturating upper 90s on room air, and with stable blood pressure.  EKG features sinus rhythm.  Head CT from the day before was negative for acute findings and chest x-ray with very mild right basilar atelectasis or early infiltrate.  Chemistry  panel notable for glucose of 63.  CBC with slight normocytic anemia.  Lactic acid normal.  COVID-19 was positive in the ED the day before.  Patient was given some juice in the emergency department and hospitalists asked to admit.  Review of Systems:  Unable to complete ROS secondary to patient's clinical condition.  Past Medical History:  Diagnosis Date   Dementia ([place])    Diabetes ([place])     Past Surgical History:  Procedure Laterality Date   CATARACT EXTRACTION, BILATERAL     TONSILLECTOMY AND ADENOIDECTOMY  1965    Social History:   reports that he has never smoked. He has never used smokeless tobacco. He reports that he does not drink alcohol. No history on file for drug use.  Allergies  Allergen Reactions   Sitagliptin     Other reaction(s): ABDOMINAL PAIN    Family History  Problem Relation Age of Onset   Arthritis Other        parent   Lung cancer Other        parent   Heart disease Other        parent   Diabetes Other        parent   Brain cancer Mother        tumor   Heart disease Father    Anxiety disorder Sister    Heart disease Brother    Diabetes Brother    Mental illness Sister    Healthy Daughter  Prior to Admission medications   Medication Sig Start Date End Date Taking? Authorizing Provider  acetaminophen (TYLENOL) 500 MG tablet Take 500 mg by mouth every 6 (six) hours as needed for mild pain, moderate pain, fever or headache.   Yes [provider]  glimepiride (AMARYL) 4 MG tablet Take 4 mg by mouth daily with breakfast.   Yes [provider]  metFORMIN (GLUCOPHAGE) 1000 MG tablet Take 1,000 mg by mouth 2 (two) times daily. 02/27/20  Yes [provider]  vitamin B-12 (CYANOCOBALAMIN) 500 MCG tablet Take 500 mcg by mouth daily.   Yes [provider]    Physical Exam: Vitals:   03/09/2021 2345 02/21/21 0000 02/21/21 0015 02/21/21 0045  BP: (!) 160/66 (!) 151/98 (!) 155/65 (!) 161/85  Pulse: 79 75 80 77   Resp: 18 16 (!) 25 13  Temp:      SpO2: 97% 99% 98% 100%    Constitutional: NAD, listless  Eyes: PERTLA, lids and conjunctivae normal ENMT: Mucous membranes are moist. Posterior pharynx clear of any exudate or lesions.   Neck:  supple, no masses  Respiratory: no wheezing, no crackles. No accessory muscle use.  Cardiovascular: S1 & S2 heard, regular rate and rhythm. Trace lower leg edema bilaterally.   Abdomen: No distension, no tenderness, soft. Bowel sounds active.  Musculoskeletal: no clubbing / cyanosis. No joint deformity upper and lower extremities.   Skin: no significant rashes, lesions, ulcers. Seborrheic keratoses. Neurologic: Sleeping, wakes to voice. CN 2-12 grossly intact. Moving all extremities.    Labs and Imaging on Admission: I have personally reviewed following labs and imaging studies  CBC: Recent Labs  Lab 02/19/21 2200 02/28/2021 2330  WBC 6.1 5.9  NEUTROABS 4.2 3.7  HGB 11.1* 11.7*  HCT 34.0* 36.1*  MCV 98.0 96.8  PLT 170 0000000   Basic Metabolic Panel: Recent Labs  Lab 02/19/21 2200 02/16/2021 2330  NA 137 136  K 4.0 3.5  CL 101 100  CO2 27 26  GLUCOSE 104* 63*  BUN 17 12  CREATININE 0.73 0.69  CALCIUM 8.1* 8.2*   GFR: Estimated Creatinine Clearance: 65.5 mL/min (by C-G formula based on SCr of 0.69 mg/dL). Liver Function Tests: Recent Labs  Lab 02/19/21 2200 02/18/2021 2330  AST 19 26  ALT 12 17  ALKPHOS 56 49  BILITOT 0.6 0.5  PROT 6.2* 5.9*  ALBUMIN 3.3* 3.2*   No results for input(s): LIPASE, AMYLASE in the last 168 hours. Recent Labs  Lab 02/19/21 2200  AMMONIA 10   Coagulation Profile: Recent Labs  Lab 02/19/21 2200  INR 1.0   Cardiac Enzymes: No results for input(s): CKTOTAL, CKMB, CKMBINDEX, TROPONINI in the last 168 hours. BNP (last 3 results) No results for input(s): PROBNP in the last 8760 hours. HbA1C: No results for input(s): HGBA1C in the last 72 hours. CBG: Recent Labs  Lab 02/19/21 2219  GLUCAP 104*    Lipid Profile: No results for input(s): CHOL, HDL, LDLCALC, TRIG, CHOLHDL, LDLDIRECT in the last 72 hours. Thyroid Function Tests: No results for input(s): TSH, T4TOTAL, FREET4, T3FREE, THYROIDAB in the last 72 hours. Anemia Panel: No results for input(s): VITAMINB12, FOLATE, FERRITIN, TIBC, IRON, RETICCTPCT in the last 72 hours. Urine analysis:    Component Value Date/Time   COLORURINE YELLOW 02/19/2021 2145   APPEARANCEUR CLEAR 02/19/2021 2145   LABSPEC 1.021 02/19/2021 2145   PHURINE 5.0 02/19/2021 2145   GLUCOSEU NEGATIVE 02/19/2021 2145   HGBUR SMALL (A) 02/19/2021 2145   BILIRUBINUR NEGATIVE  02/19/2021 2145   KETONESUR 5 (A) 02/19/2021 2145   PROTEINUR NEGATIVE 02/19/2021 2145   NITRITE NEGATIVE 02/19/2021 2145   LEUKOCYTESUR NEGATIVE 02/19/2021 2145   Sepsis Labs: '@[id number]'$ (QA348G) ) Recent Results (from the past 240 hour(s))  Resp Panel by RT-PCR (Flu A&B, Covid) Nasopharyngeal Swab     Status: Abnormal   Collection Time: 02/19/21  9:46 PM   Specimen: Nasopharyngeal Swab; Nasopharyngeal(NP) swabs in vial transport medium  Result Value Ref Range Status   SARS Coronavirus 2 by RT PCR POSITIVE (A) NEGATIVE Final    Comment: RESULT CALLED TO, READ BACK BY AND VERIFIED WITH: Marina Gravel @ [id number] ON 02/26/2021 C VARNER (NOTE) SARS-CoV-2 target nucleic acids are DETECTED.  The SARS-CoV-2 RNA is generally detectable in upper respiratory specimens during the acute phase of infection. Positive results are indicative of the presence of the identified virus, but do not rule out bacterial infection or co-infection with other pathogens not detected by the test. Clinical correlation with patient history and other diagnostic information is necessary to determine patient infection status. The expected result is Negative.  Fact Sheet for Patients: EntrepreneurPulse.com.au  Fact Sheet for Healthcare  Providers: IncredibleEmployment.be  This test is not yet approved or cleared by the [place] FDA and  has been authorized for detection and/or diagnosis of SARS-CoV-2 by FDA under an Emergency Use Authorization (EUA).  This EUA will remain in effect (meaning this te st can be used) for the duration of  the COVID-19 declaration under Section 564(b)(1) of the Act, 21 U.S.C. section 360bbb-3(b)(1), unless the authorization is terminated or revoked sooner.     Influenza A by PCR NEGATIVE NEGATIVE Final   Influenza B by PCR NEGATIVE NEGATIVE Final    Comment: (NOTE) The Xpert Xpress SARS-CoV-2/FLU/RSV plus assay is intended as an aid in the diagnosis of influenza from Nasopharyngeal swab specimens and should not be used as a sole basis for treatment. Nasal washings and aspirates are unacceptable for Xpert Xpress SARS-CoV-2/FLU/RSV testing.  Fact Sheet for Patients: EntrepreneurPulse.com.au  Fact Sheet for Healthcare Providers: IncredibleEmployment.be  This test is not yet approved or cleared by the [place] FDA and has been authorized for detection and/or diagnosis of SARS-CoV-2 by FDA under an Emergency Use Authorization (EUA). This EUA will remain in effect (meaning this test can be used) for the duration of the COVID-19 declaration under Section 564(b)(1) of the Act, 21 U.S.C. section 360bbb-3(b)(1), unless the authorization is terminated or revoked.  Performed at [facility], [place] [address], [place], [place] [address]   Blood Culture (routine x 2)     Status: None (Preliminary result)   Collection Time: 02/19/21  9:55 PM   Specimen: BLOOD  Result Value Ref Range Status   Specimen Description   Final    BLOOD BLOOD RIGHT FOREARM Performed at [place] [address], [place], [place] [address]    Special Requests   Final    BOTTLES DRAWN AEROBIC AND ANAEROBIC Blood  Culture adequate volume Performed at [facility] [place] [address], [place], [place] [address]    Culture   Final    NO GROWTH < 12 HOURS Performed at [facility] [address], [place], [place] [address]    Report Status PENDING  Incomplete  Blood Culture (routine x 2)     Status: None (Preliminary result)   Collection Time: 02/19/21 10:10 PM   Specimen: BLOOD  Result Value Ref Range Status   Specimen  Description   Final    BLOOD RIGHT ANTECUBITAL Performed at [facility] [address] [place], [place] [address]    Special Requests   Final    BOTTLES DRAWN AEROBIC AND ANAEROBIC Blood Culture adequate volume Performed at [place] [place] [address], [place], [place] [address]    Culture   Final    NO GROWTH < 12 HOURS Performed at [facility] [address], [place], [place] [address]    Report Status PENDING  Incomplete     Radiological Exams on Admission: CT HEAD WO CONTRAST (5MM)  Result Date: 02/19/2021 CLINICAL DATA:  Mental status change EXAM: CT HEAD WITHOUT CONTRAST TECHNIQUE: Contiguous axial images were obtained from the base of the skull through the vertex without intravenous contrast. COMPARISON:  06/12/2020 CT FINDINGS: Brain: No acute territorial infarction, hemorrhage or new intracranial mass. 7 mm extra-axial mass along the left vertex best seen on coronal views, series 4, image 33 probably a small noncalcified meningioma, this is without change. Atrophy and mild chronic small vessel ischemic changes of the white matter. Stable ventricle size. Vascular: No hyperdense fluid collection. Carotid vascular calcification Skull: Normal. Negative for fracture or focal lesion. Sinuses/Orbits: No acute finding. Other: None IMPRESSION: 1. No CT evidence for acute intracranial abnormality. 2. Atrophy and chronic small vessel ischemic changes of the white matter. Electronically Signed   By: Donavan Foil  M.D.   On: 02/19/2021 22:55   DG Chest Portable 1 View  Result Date: 02/12/2021 CLINICAL DATA:  COVID positive with fever and malaise x2 days. EXAM: PORTABLE CHEST 1 VIEW COMPARISON:  February 19, 2021 FINDINGS: Very mild atelectasis and/or early infiltrate is seen within the right lung base. There is no evidence of a pleural effusion or pneumothorax. The heart size and mediastinal contours are within normal limits. Mild to moderate severity calcification of the aortic arch is noted. The visualized skeletal structures are unremarkable. IMPRESSION: Very mild right basilar atelectasis and/or early infiltrate. Electronically Signed   By: Virgina Norfolk M.D.   On: 02/19/2021 22:32   DG Chest Port 1 View  Result Date: 02/19/2021 CLINICAL DATA:  07/30/2016 EXAM: PORTABLE CHEST 1 VIEW COMPARISON:  None. FINDINGS: Heart and mediastinal contours are within normal limits. No focal opacities or effusions. No acute bony abnormality. Aortic atherosclerosis. IMPRESSION: No active cardiopulmonary disease. Electronically Signed   By: Rolm Baptise M.D.   On: 02/19/2021 22:13    EKG: Independently reviewed. Sinus rhythm,   Assessment/Plan   1. Acute encephalopathy  - Presents with 2 days of fatigue, anorexia, and increased confusion  - ED workup includes positive COVID test, head CT negative for acute findings, UA not suggestive of infection, and mild hypoglycemia  - This is most likely secondary to COVID-19 infection  - Continue supportive care, start remdesivir, monitor    2. COVID-19 - Presents with 2 days of marked fatigue, increased confusion, anorexia, and fevers and is found to have COVID-19  - No respiratory s/s noted  - Plan to start remdesivir, continue supportive care, check/trend inflammatory markers    3. Hypoglycemia; type II DM  - A1c was 7.5% in 2016  - Serum glucose 63 in ED  - Hold glimepiride, check CBGs, use low-intensity SSI if needed   4. Dementia  - At baseline he is oriented to  person and place, ambulates unassisted, feeds self, and uses bathroom by himself   - Delirium precautions     DVT prophylaxis: Lovenox  Code Status: DNR, confirmed on admission    Level of Care: Level of care: Med-Surg Family Communication: Wife updated by phone   Disposition Plan:  Patient is from: home  Anticipated d/c is to: Wife wants him back home  Anticipated d/c date is: 8/14 - 02/23/21 Patient currently: Pending ability to tolerate adequate PO intake  Consults called: none  Admission status: Observation     Vianne Bulls, MD [facility]  02/21/2021, 1:44 AM

## 2021-02-21 NOTE — ED Notes (Signed)
Stanley Gilbert attempted to feed pt this am. Pt made no effort to chew food or swallow. Food taken out of pt's mouth. Dr. Rodena Piety notified. Consult for Speech place. Pt npo until consult.

## 2021-02-21 NOTE — Evaluation (Signed)
Clinical/Bedside Swallow Evaluation Patient Details  Name: KOVIN DEMMA MRN: [id number] Date of Birth: [birth year]  Today's Date: 02/21/2021 Time: SLP Start Time (ACUTE ONLY): 1810 SLP Stop Time (ACUTE ONLY): 1830 SLP Time Calculation (min) (ACUTE ONLY): 20 min  Past Medical History:  Past Medical History:  Diagnosis Date   Dementia ([place])    Diabetes ([place])    Past Surgical History:  Past Surgical History:  Procedure Laterality Date   CATARACT EXTRACTION, BILATERAL     TONSILLECTOMY AND ADENOIDECTOMY  1965   HPI:  Patient is an 87 y.o. male with PMH: dementia and type 2 diabetes mellitus who presented to ER from home for evaluation of fatigue, general weakness, anorexia, increased confusion, and fevers. In ED, head CT negative for acute findings, urinalysis not suggestive of infection and positive COVID-19 PCR. Per wife report in ED, she has been having difficulty getting patient to eat anything for past two days. CXR revealed very mild right basilar atelectasis or early infiltrate.   Assessment / Plan / Recommendation Clinical Impression  Patient presents with a mod-severe oropharyngeal dysphagia suspected to be secondary to advanced dementia. Patient was awake and alert and did attempt to follow cues for oral motor movements of sticking out tongue and opening mouth. He was not able to fully open mouth and was not able to protrude tongue beyond teeth. SLP did observe presence of lingual frenulum which may contribute somewhat to patient's inability to protrude tongue. He exhibited limited to no ability to manipulate small pieces of ice chips or with teaspoon sips of water. Swallow initiation appeared significantly delayed and hyolaryngeal elevation was limited. No overt s/s aspiration or penetration observed. Unfortunately, prognosis for safe PO intake is guarded secondary to dysphagia appearing to be primarily secondary to dementia. SLP Visit Diagnosis: Dysphagia, unspecified (R13.10)     Aspiration Risk  Severe aspiration risk;Risk for inadequate nutrition/hydration    Diet Recommendation NPO   Medication Administration: Via alternative means    Other  Recommendations Oral Care Recommendations: Oral care QID;Staff/trained caregiver to provide oral care   Follow up Recommendations Other (comment) (TBD pending patient progress and GOC)      Frequency and Duration            Prognosis Prognosis for Safe Diet Advancement: Guarded      Swallow Study   General Date of Onset: 02/21/21 HPI: Patient is an 87 y.o. male with PMH: dementia and type 2 diabetes mellitus who presented to ER from home for evaluation of fatigue, general weakness, anorexia, increased confusion, and fevers. In ED, head CT negative for acute findings, urinalysis not suggestive of infection and positive COVID-19 PCR. Per wife report in ED, she has been having difficulty getting patient to eat anything for past two days. CXR revealed very mild right basilar atelectasis or early infiltrate. Type of Study: Bedside Swallow Evaluation Previous Swallow Assessment: none found Diet Prior to this Study: NPO Temperature Spikes Noted: No Respiratory Status: Room air History of Recent Intubation: No Behavior/Cognition: Alert;Pleasant mood;Cooperative Oral Cavity Assessment: Other (comment) (trace amount of dried/sticky oral secretions) Oral Care Completed by SLP: Yes Oral Cavity - Dentition: Adequate natural dentition Self-Feeding Abilities: Total assist Patient Positioning: Upright in bed Baseline Vocal Quality: Low vocal intensity Volitional Cough: Cognitively unable to elicit Volitional Swallow: Unable to elicit    Oral/Motor/Sensory Function Overall Oral Motor/Sensory Function: Moderate impairment Facial ROM: Reduced right;Reduced left Facial Symmetry: Within Functional Limits Facial Strength: Reduced right;Reduced left Lingual ROM: Reduced right;Reduced left Lingual Symmetry:  Within Functional  Limits Lingual Strength: Reduced Mandible: Impaired   Ice Chips Ice chips: Impaired Oral Phase Impairments: Poor awareness of bolus;Reduced lingual movement/coordination;Reduced labial seal Pharyngeal Phase Impairments: Decreased hyoid-laryngeal movement;Suspected delayed Swallow   Thin Liquid Thin Liquid: Impaired Presentation: Spoon Oral Phase Impairments: Poor awareness of bolus;Reduced lingual movement/coordination Oral Phase Functional Implications: Prolonged oral transit Pharyngeal  Phase Impairments: Suspected delayed Swallow;Decreased hyoid-laryngeal movement    Nectar Thick     Honey Thick     Puree Puree: Not tested   Solid     Solid: Not tested     Sonia Baller, MA, CCC-SLP Speech Therapy

## 2021-02-22 DIAGNOSIS — G9340 Encephalopathy, unspecified: Secondary | ICD-10-CM | POA: Diagnosis not present

## 2021-02-22 LAB — CBC WITH DIFFERENTIAL/PLATELET
Abs Immature Granulocytes: 0.02 10*3/uL (ref 0.00–0.07)
Basophils Absolute: 0 10*3/uL (ref 0.0–0.1)
Basophils Relative: 0 %
Eosinophils Absolute: 0 10*3/uL (ref 0.0–0.5)
Eosinophils Relative: 0 %
HCT: 36.8 % — ABNORMAL LOW (ref 39.0–52.0)
Hemoglobin: 12.4 g/dL — ABNORMAL LOW (ref 13.0–17.0)
Immature Granulocytes: 0 %
Lymphocytes Relative: 15 %
Lymphs Abs: 0.7 10*3/uL (ref 0.7–4.0)
MCH: 31.6 pg (ref 26.0–34.0)
MCHC: 33.7 g/dL (ref 30.0–36.0)
MCV: 93.9 fL (ref 80.0–100.0)
Monocytes Absolute: 0.7 10*3/uL (ref 0.1–1.0)
Monocytes Relative: 13 %
Neutro Abs: 3.6 10*3/uL (ref 1.7–7.7)
Neutrophils Relative %: 72 %
Platelets: 168 10*3/uL (ref 150–400)
RBC: 3.92 MIL/uL — ABNORMAL LOW (ref 4.22–5.81)
RDW: 13.4 % (ref 11.5–15.5)
WBC: 5 10*3/uL (ref 4.0–10.5)
nRBC: 0 % (ref 0.0–0.2)

## 2021-02-22 LAB — FERRITIN: Ferritin: 34 ng/mL (ref 24–336)

## 2021-02-22 LAB — COMPREHENSIVE METABOLIC PANEL
ALT: 21 U/L (ref 0–44)
AST: 29 U/L (ref 15–41)
Albumin: 3.3 g/dL — ABNORMAL LOW (ref 3.5–5.0)
Alkaline Phosphatase: 54 U/L (ref 38–126)
Anion gap: 11 (ref 5–15)
BUN: 13 mg/dL (ref 8–23)
CO2: 25 mmol/L (ref 22–32)
Calcium: 7.9 mg/dL — ABNORMAL LOW (ref 8.9–10.3)
Chloride: 98 mmol/L (ref 98–111)
Creatinine, Ser: 0.59 mg/dL — ABNORMAL LOW (ref 0.61–1.24)
GFR, Estimated: >60 mL/min (ref >60)
Glucose, Bld: 159 mg/dL — ABNORMAL HIGH (ref 70–99)
Potassium: 3.2 mmol/L — ABNORMAL LOW (ref 3.5–5.1)
Sodium: 134 mmol/L — ABNORMAL LOW (ref 135–145)
Total Bilirubin: 0.7 mg/dL (ref 0.3–1.2)
Total Protein: 6.2 g/dL — ABNORMAL LOW (ref 6.5–8.1)

## 2021-02-22 LAB — GLUCOSE, CAPILLARY
Glucose-Capillary: 106 mg/dL — ABNORMAL HIGH (ref 70–99)
Glucose-Capillary: 129 mg/dL — ABNORMAL HIGH (ref 70–99)
Glucose-Capillary: 133 mg/dL — ABNORMAL HIGH (ref 70–99)
Glucose-Capillary: 133 mg/dL — ABNORMAL HIGH (ref 70–99)
Glucose-Capillary: 155 mg/dL — ABNORMAL HIGH (ref 70–99)
Glucose-Capillary: 156 mg/dL — ABNORMAL HIGH (ref 70–99)
Glucose-Capillary: 174 mg/dL — ABNORMAL HIGH (ref 70–99)

## 2021-02-22 LAB — PHOSPHORUS: Phosphorus: 3.5 mg/dL (ref 2.5–4.6)

## 2021-02-22 LAB — MAGNESIUM: Magnesium: 1.6 mg/dL — ABNORMAL LOW (ref 1.7–2.4)

## 2021-02-22 LAB — D-DIMER, QUANTITATIVE: D-Dimer, Quant: 1.03 ug/mL-FEU — ABNORMAL HIGH (ref 0.00–0.50)

## 2021-02-22 LAB — C-REACTIVE PROTEIN: CRP: 2.7 mg/dL — ABNORMAL HIGH (ref <1.0)

## 2021-02-22 MED ORDER — HYDRALAZINE HCL 20 MG/ML IJ SOLN
5.0000 mg | Freq: Four times a day (QID) | INTRAMUSCULAR | Status: DC | PRN
  Administered 2021-03-03 – 2021-03-04 (×2): 5 mg via INTRAVENOUS
  Filled 2021-02-22 (×2): qty 1

## 2021-02-22 MED ORDER — MAGNESIUM SULFATE 2 GM/50ML IV SOLN
2.0000 g | Freq: Once | INTRAVENOUS | Status: AC
  Administered 2021-02-22: 2 g via INTRAVENOUS
  Filled 2021-02-22: qty 50

## 2021-02-22 MED ORDER — POTASSIUM CHLORIDE 10 MEQ/100ML IV SOLN
10.0000 meq | INTRAVENOUS | Status: AC
  Administered 2021-02-22 (×4): 10 meq via INTRAVENOUS
  Filled 2021-02-22 (×4): qty 100

## 2021-02-22 MED ORDER — LIP MEDEX EX OINT
TOPICAL_OINTMENT | CUTANEOUS | Status: DC | PRN
  Filled 2021-02-22: qty 7

## 2021-02-22 MED ORDER — SODIUM CHLORIDE 0.9 % IV SOLN: INTRAVENOUS | Status: DC

## 2021-02-22 NOTE — Plan of Care (Signed)
  Problem: Safety: Goal: Ability to remain free from injury will improve Outcome: Progressing   Problem: Skin Integrity: Goal: Risk for impaired skin integrity will decrease Outcome: Progressing   Problem: Respiratory: Goal: Will maintain a patent airway Outcome: Progressing

## 2021-02-22 NOTE — Progress Notes (Signed)
PROGRESS NOTE    Stanley Gilbert  [id number] DOB: 08-31-1930 DOA: 02/10/2021 PCP: [name], MD   Brief Narrative: HPI Per Dr. Myna Hidalgo on 02/21/2021 Stanley Gilbert is a 87 y.o. male with medical history significant for dementia and type 2 diabetes mellitus, now presenting to the emergency department for evaluation of fatigue, general weakness, anorexia, increased confusion, and fevers.  History is mainly obtained from the patient's wife.  Patient lives with his wife who prepares his meals but notes that he is normally able to feed himself, ambulate unassisted, and use the bathroom on his own.  Beginning 2 days ago, she noted the patient to be more confused than usual, severely fatigued, and having some low-grade fevers.  He was brought into the ED for evaluation of this on 02/19/2021 where he had a head CT negative for acute findings, urinalysis not suggestive of infection, and positive COVID-19 PCR.  He returned home from the emergency department but has continued to grow more fatigued and generally weak to the point where he cannot get out of bed despite his wife attempting to assist him.  She has been having trouble getting him to eat anything for the past 2 days but eventually got him to have some ice cream last night.  He is normally oriented to person and place only but has been more confused for the past 2 days.    Patient's wife notes that she has been struggling to arrange home health for her husband, was encouraged to consider having him placed in a nursing facility, but is adamant that she will not do that.   ED Course: Upon arrival to the ED, patient is found to be afebrile, saturating upper 90s on room air, and with stable blood pressure.  EKG features sinus rhythm.  Head CT from the day before was negative for acute findings and chest x-ray with very mild right basilar atelectasis or early infiltrate.  Chemistry panel notable for glucose of 63.  CBC with slight normocytic anemia.   Lactic acid normal.  COVID-19 was positive in the ED the day before.  Patient was given some juice in the emergency department and hospitalists asked to admit.  Assessment & Plan:   Principal Problem:   Acute encephalopathy Active Problems:   Type 2 diabetes mellitus with hypoglycemia without coma (HCC)   Acute COVID-19   Dementia (HCC)  #1 covid pneumonia-he is not in any respiratory distress,on RA. Continue Remdesvir  Continue supportive care  Crp 2.7  Procal low Ferritin 34 Wbc 5.0 D dimer 1.03  His chest x-ray shows some left basilar infiltrates I have not started him on antibiotics as this is more suggestive of COVID.  #2 hyponatremia/hypokalemia/hypomagnesemia-replete lytes DC D5 and change fluids to NS 75 cc an hour.  #3 type 2 dm CBG (last 3) he is n.p.o. due to high aspiration risk on NS monitor blood sugar closely.  Hold glimepiride. Recent Labs    02/22/21 0518 02/22/21 0736 02/22/21 0826  GLUCAP 174* 156* 155*   #4 history of dementia patient lives at home with his wife who is 59 years old been married for 16 years.  Per her at baseline patient was able to ambulate in the house feed himself use the restroom by himself still he was admitted to the hospital.  #5 goals of care patient is DNR however his wife wants [place] to put a PEG tube in him and nourished him so she can take him home.  I have  consulted palliative care.  #6 high aspiration risk patient n.p.o. continue NS for now.   Pressure Injury 02/21/21 Buttocks Left Stage 2 -  Partial thickness loss of dermis presenting as a shallow open injury with a red, pink wound bed without slough. (Active)  02/21/21 1700  Location: Buttocks  Location Orientation: Left  Staging: Stage 2 -  Partial thickness loss of dermis presenting as a shallow open injury with a red, pink wound bed without slough.  Wound Description (Comments):   Present on Admission: Yes    Estimated body mass index is 19.55 kg/m as calculated from  the following:   Height as of 02/19/21: '5\' 11"'$  (1.803 m).   Weight as of this encounter: 63.6 kg.  DVT prophylaxis: Lovenox  code Status: DO NOT RESUSCITATE  family Communication: Discussed with his wife [name] on the phone  disposition Plan:  Status is: Inpatient  Remains inpatient appropriate because:Persistent severe electrolyte disturbances  Dispo: The patient is from: Home              Anticipated d/c is to: Home              Patient currently is not medically stable to d/c.   Difficult to place patient No       Consultants:  none  Procedures:none  Antimicrobials:none   Subjective: He is resting in in bed  Speech therapy evaluated him yesterday and he is at high aspiration risk is oral mucous is dry he has IV fluids going at 75 cc an hour  Objective: Vitals:   02/21/21 1445 02/21/21 1649 02/21/21 2010 02/21/21 2014  BP: 135/77 (!) 160/97  (!) 142/69  Pulse: (!) 48 78  69  Resp: '18 16  16  '$ Temp:  98.4 F (36.9 C)  98.3 F (36.8 C)  TempSrc:  Oral  Oral  SpO2: 97% 98%  98%  Weight:   63.6 kg     Intake/Output Summary (Last 24 hours) at 02/22/2021 1123 Last data filed at 02/22/2021 0600 Gross per 24 hour  Intake 875.07 ml  Output 425 ml  Net 450.07 ml   Filed Weights   02/21/21 2010  Weight: 63.6 kg    Examination:  General exam: Appears calm and comfortable  Respiratory system: Clear to auscultation. Respiratory effort normal. Cardiovascular system: S1 & S2 heard, RRR. No JVD, murmurs, rubs, gallops or clicks. No pedal edema. Gastrointestinal system: Abdomen is nondistended, soft and nontender. No organomegaly or masses felt. Normal bowel sounds heard. Central nervous system: Alert and oriented. No focal neurological deficits. Extremities: Symmetric 5 x 5 power. Skin: No rashes, lesions or ulcers Psychiatry: Judgement and insight appear normal. Mood & affect appropriate.     Data Reviewed: I have personally reviewed following labs and imaging  studies  CBC: Recent Labs  Lab 02/19/21 2200 02/14/2021 2330 02/22/21 0332  WBC 6.1 5.9 5.0  NEUTROABS 4.2 3.7 3.6  HGB 11.1* 11.7* 12.4*  HCT 34.0* 36.1* 36.8*  MCV 98.0 96.8 93.9  PLT 170 162 [id number]   Basic Metabolic Panel: Recent Labs  Lab 02/19/21 2200 02/23/2021 2330 02/22/21 0332  NA 137 136 134*  K 4.0 3.5 3.2*  CL 101 100 98  CO2 '27 26 25  '$ GLUCOSE 104* 63* 159*  BUN '17 12 13  '$ CREATININE 0.73 0.69 0.59*  CALCIUM 8.1* 8.2* 7.9*  MG  --   --  1.6*  PHOS  --   --  3.5   GFR: Estimated Creatinine Clearance: 57.4 mL/min (A) (  by C-G formula based on SCr of 0.59 mg/dL (L)). Liver Function Tests: Recent Labs  Lab 02/19/21 2200 02/09/2021 2330 02/22/21 0332  AST '19 26 29  '$ ALT '12 17 21  '$ ALKPHOS 56 49 54  BILITOT 0.6 0.5 0.7  PROT 6.2* 5.9* 6.2*  ALBUMIN 3.3* 3.2* 3.3*   No results for input(s): LIPASE, AMYLASE in the last 168 hours. Recent Labs  Lab 02/19/21 2200  AMMONIA 10   Coagulation Profile: Recent Labs  Lab 02/19/21 2200  INR 1.0   Cardiac Enzymes: No results for input(s): CKTOTAL, CKMB, CKMBINDEX, TROPONINI in the last 168 hours. BNP (last 3 results) No results for input(s): PROBNP in the last 8760 hours. HbA1C: Recent Labs    02/21/21 0542  HGBA1C 7.3*   CBG: Recent Labs  Lab 02/21/21 2105 02/22/21 0047 02/22/21 0518 02/22/21 0736 02/22/21 0826  GLUCAP 109* 133* 174* 156* 155*   Lipid Profile: No results for input(s): CHOL, HDL, LDLCALC, TRIG, CHOLHDL, LDLDIRECT in the last 72 hours. Thyroid Function Tests: No results for input(s): TSH, T4TOTAL, FREET4, T3FREE, THYROIDAB in the last 72 hours. Anemia Panel: Recent Labs    02/22/21 0332  FERRITIN 34   Sepsis Labs: Recent Labs  Lab 02/19/21 2200 02/15/2021 2330 02/21/21 0542  PROCALCITON  --   --  <0.10  LATICACIDVEN 2.6* 1.5  --     Recent Results (from the past 240 hour(s))  Urine Culture     Status: Abnormal   Collection Time: 02/19/21  9:45 PM   Specimen: In/Out Cath  Urine  Result Value Ref Range Status   Specimen Description   Final    IN/OUT CATH URINE Performed at [facility], [place] [address] [place], [place] [address]    Special Requests   Final    NONE Performed at [facility], [place] [address] [place], [place] [address]    Culture MULTIPLE SPECIES PRESENT, SUGGEST RECOLLECTION (A)  Final   Report Status 02/21/2021 FINAL  Final  Resp Panel by RT-PCR (Flu A&B, Covid) Nasopharyngeal Swab     Status: Abnormal   Collection Time: 02/19/21  9:46 PM   Specimen: Nasopharyngeal Swab; Nasopharyngeal(NP) swabs in vial transport medium  Result Value Ref Range Status   SARS Coronavirus 2 by RT PCR POSITIVE (A) NEGATIVE Final    Comment: RESULT CALLED TO, READ BACK BY AND VERIFIED WITH: Marina Gravel @ [phone] ON 03/10/2021 C VARNER (NOTE) SARS-CoV-2 target nucleic acids are DETECTED.  The SARS-CoV-2 RNA is generally detectable in upper respiratory specimens during the acute phase of infection. Positive results are indicative of the presence of the identified virus, but do not rule out bacterial infection or co-infection with other pathogens not detected by the test. Clinical correlation with patient history and other diagnostic information is necessary to determine patient infection status. The expected result is Negative.  Fact Sheet for Patients: EntrepreneurPulse.com.au  Fact Sheet for Healthcare Providers: IncredibleEmployment.be  This test is not yet approved or cleared by the [place] FDA and  has been authorized for detection and/or diagnosis of SARS-CoV-2 by FDA under an Emergency Use Authorization (EUA).  This EUA will remain in effect (meaning this te st can be used) for the duration of  the COVID-19 declaration under Section 564(b)(1) of the Act, 21 U.S.C. section 360bbb-3(b)(1), unless the authorization is terminated or revoked sooner.     Influenza A by  PCR NEGATIVE NEGATIVE Final   Influenza B by PCR NEGATIVE NEGATIVE Final  Comment: (NOTE) The Xpert Xpress SARS-CoV-2/FLU/RSV plus assay is intended as an aid in the diagnosis of influenza from Nasopharyngeal swab specimens and should not be used as a sole basis for treatment. Nasal washings and aspirates are unacceptable for Xpert Xpress SARS-CoV-2/FLU/RSV testing.  Fact Sheet for Patients: EntrepreneurPulse.com.au  Fact Sheet for Healthcare Providers: IncredibleEmployment.be  This test is not yet approved or cleared by the [place] FDA and has been authorized for detection and/or diagnosis of SARS-CoV-2 by FDA under an Emergency Use Authorization (EUA). This EUA will remain in effect (meaning this test can be used) for the duration of the COVID-19 declaration under Section 564(b)(1) of the Act, 21 U.S.C. section 360bbb-3(b)(1), unless the authorization is terminated or revoked.  Performed at [facility], [place] [address] [place], [place] [address]   Blood Culture (routine x 2)     Status: None (Preliminary result)   Collection Time: 02/19/21  9:55 PM   Specimen: BLOOD  Result Value Ref Range Status   Specimen Description   Final    BLOOD BLOOD RIGHT FOREARM Performed at [facility] [address] [place], [place] [address]    Special Requests   Final    BOTTLES DRAWN AEROBIC AND ANAEROBIC Blood Culture adequate volume Performed at [facility] [address], [place], [place] [address]    Culture   Final    NO GROWTH 2 DAYS Performed at [place] [address] [place], [place] [address]    Report Status PENDING  Incomplete  Blood Culture (routine x 2)     Status: None (Preliminary result)   Collection Time: 02/19/21 10:10 PM   Specimen: BLOOD  Result Value Ref Range Status   Specimen Description   Final    BLOOD RIGHT ANTECUBITAL Performed at [place] [address] [place], [place] [address]    Special Requests   Final    BOTTLES DRAWN AEROBIC AND ANAEROBIC Blood Culture adequate volume Performed at [facility] [address], [place], [place] [address]    Culture   Final    NO GROWTH 2 DAYS Performed at [facility] [address], [place], [place] [address]    Report Status PENDING  Incomplete         Radiology Studies: DG Chest Portable 1 View  Result Date: 02/25/2021 CLINICAL DATA:  COVID positive with fever and malaise x2 days. EXAM: PORTABLE CHEST 1 VIEW COMPARISON:  February 19, 2021 FINDINGS: Very mild atelectasis and/or early infiltrate is seen within the right lung base. There is no evidence of a pleural effusion or pneumothorax. The heart size and mediastinal contours are within normal limits. Mild to moderate severity calcification of the aortic arch is noted. The visualized skeletal structures are unremarkable. IMPRESSION: Very mild right basilar atelectasis and/or early infiltrate. Electronically Signed   By: Virgina Norfolk M.D.   On: 02/27/2021 22:32        Scheduled Meds:  chlorhexidine  15 mL Mouth Rinse BID   enoxaparin (LOVENOX) injection  40 mg Subcutaneous Q24H   insulin aspart  0-6 Units Subcutaneous Q4H   mouth rinse  15 mL Mouth Rinse q12n4p   Continuous Infusions:  dextrose 5 % and 0.9% NaCl 75 mL/hr at 02/22/21 0515   remdesivir 100 mg in NS 100 mL 100 mg (02/22/21 1105)     LOS: 1 day    Time spent: 40 min    Benjamine Mola  Orlie Pollen, MD  02/22/2021, 11:23 AM

## 2021-02-22 NOTE — Evaluation (Signed)
Physical Therapy Evaluation-1x Patient Details Name: Stanley Gilbert MRN: [id number] DOB: [birth year] Today's Date: 02/22/2021   History of Present Illness  87 yo male admitted with COVID, weakness. Hx of dementia, DM  Clinical Impression  Bed level eval only. Pt does not follow commands or initiate any movement. He did not speak or respond to any questions. No family present during eval. Pt is not able to participate meaningfully with therapy. He is not appropriate for acute PT services. Will sign off. 1x eval.     Follow Up Recommendations No PT follow up;Supervision/Assistance - 24 hour    Equipment Recommendations  None recommended by PT    Recommendations for Other Services       Precautions / Restrictions Precautions Precautions: Fall Restrictions Weight Bearing Restrictions: No      Mobility  Bed Mobility               General bed mobility comments: Pt does not initiate and or follow commands. Very rigid LEs. UEs weak.    Transfers                    Ambulation/Gait                Stairs            Wheelchair Mobility    Modified Rankin (Stroke Patients Only)       Balance                                             Pertinent Vitals/Pain Pain Assessment: Faces Faces Pain Scale: No hurt    Home Living Family/patient expects to be discharged to:: Unsure Living Arrangements: Spouse/significant other                    Prior Function           Comments: unsure of PLOF. Pt is unable to provide any info. No family present     Hand Dominance        Extremity/Trunk Assessment   Upper Extremity Assessment Upper Extremity Assessment: Generalized weakness;Difficult to assess due to impaired cognition    Lower Extremity Assessment Lower Extremity Assessment: Difficult to assess due to impaired cognition (very rigid)       Communication   Communication: Expressive difficulties;Receptive  difficulties  Cognition Arousal/Alertness: Lethargic Behavior During Therapy: Flat affect Overall Cognitive Status: History of cognitive impairments - at baseline                                        General Comments      Exercises     Assessment/Plan    PT Assessment Patent does not need any further PT services  PT Problem List         PT Treatment Interventions      PT Goals (Current goals can be found in the Care Plan section)  Acute Rehab PT Goals Patient Stated Goal: pt unable PT Goal Formulation: All assessment and education complete, DC therapy    Frequency     Barriers to discharge        Co-evaluation               AM-PAC PT "6 Clicks" Mobility  Outcome Measure Help needed  turning from your back to your side while in a flat bed without using bedrails?: Total Help needed moving from lying on your back to sitting on the side of a flat bed without using bedrails?: Total Help needed moving to and from a bed to a chair (including a wheelchair)?: Total Help needed standing up from a chair using your arms (e.g., wheelchair or bedside chair)?: Total Help needed to walk in hospital room?: Total Help needed climbing 3-5 steps with a railing? : Total 6 Click Score: 6    End of Session   Activity Tolerance: Patient tolerated treatment well Patient left: in bed;with call bell/phone within reach;with bed alarm set        Time: 1435-1444 PT Time Calculation (min) (ACUTE ONLY): 9 min   Charges:   PT Evaluation $PT Eval Low Complexity: [place], PT Acute Rehabilitation  Office: [phone] Pager: [phone]

## 2021-02-23 ENCOUNTER — Inpatient Hospital Stay (HOSPITAL_COMMUNITY): Payer: Medicare Other

## 2021-02-23 DIAGNOSIS — L8990 Pressure ulcer of unspecified site, unspecified stage: Secondary | ICD-10-CM | POA: Diagnosis present

## 2021-02-23 DIAGNOSIS — Z515 Encounter for palliative care: Secondary | ICD-10-CM

## 2021-02-23 DIAGNOSIS — U071 COVID-19: Secondary | ICD-10-CM | POA: Diagnosis not present

## 2021-02-23 DIAGNOSIS — R633 Feeding difficulties, unspecified: Secondary | ICD-10-CM | POA: Diagnosis not present

## 2021-02-23 DIAGNOSIS — Z7189 Other specified counseling: Secondary | ICD-10-CM

## 2021-02-23 DIAGNOSIS — F0390 Unspecified dementia without behavioral disturbance: Secondary | ICD-10-CM | POA: Diagnosis not present

## 2021-02-23 DIAGNOSIS — G9340 Encephalopathy, unspecified: Secondary | ICD-10-CM | POA: Diagnosis not present

## 2021-02-23 LAB — COMPREHENSIVE METABOLIC PANEL
ALT: 23 U/L (ref 0–44)
AST: 31 U/L (ref 15–41)
Albumin: 3.1 g/dL — ABNORMAL LOW (ref 3.5–5.0)
Alkaline Phosphatase: 52 U/L (ref 38–126)
Anion gap: 7 (ref 5–15)
BUN: 11 mg/dL (ref 8–23)
CO2: 26 mmol/L (ref 22–32)
Calcium: 7.9 mg/dL — ABNORMAL LOW (ref 8.9–10.3)
Chloride: 101 mmol/L (ref 98–111)
Creatinine, Ser: 0.59 mg/dL — ABNORMAL LOW (ref 0.61–1.24)
GFR, Estimated: >60 mL/min (ref >60)
Glucose, Bld: 118 mg/dL — ABNORMAL HIGH (ref 70–99)
Potassium: 4.1 mmol/L (ref 3.5–5.1)
Sodium: 134 mmol/L — ABNORMAL LOW (ref 135–145)
Total Bilirubin: 0.8 mg/dL (ref 0.3–1.2)
Total Protein: 6.1 g/dL — ABNORMAL LOW (ref 6.5–8.1)

## 2021-02-23 LAB — GLUCOSE, CAPILLARY
Glucose-Capillary: 104 mg/dL — ABNORMAL HIGH (ref 70–99)
Glucose-Capillary: 107 mg/dL — ABNORMAL HIGH (ref 70–99)
Glucose-Capillary: 107 mg/dL — ABNORMAL HIGH (ref 70–99)
Glucose-Capillary: 126 mg/dL — ABNORMAL HIGH (ref 70–99)
Glucose-Capillary: 135 mg/dL — ABNORMAL HIGH (ref 70–99)
Glucose-Capillary: 138 mg/dL — ABNORMAL HIGH (ref 70–99)
Glucose-Capillary: 152 mg/dL — ABNORMAL HIGH (ref 70–99)
Glucose-Capillary: 195 mg/dL — ABNORMAL HIGH (ref 70–99)

## 2021-02-23 LAB — C-REACTIVE PROTEIN: CRP: 4.8 mg/dL — ABNORMAL HIGH (ref <1.0)

## 2021-02-23 LAB — PHOSPHORUS
Phosphorus: 2.7 mg/dL (ref 2.5–4.6)
Phosphorus: 2.3 mg/dL — ABNORMAL LOW (ref 2.5–4.6)

## 2021-02-23 LAB — D-DIMER, QUANTITATIVE: D-Dimer, Quant: 0.73 ug/mL-FEU — ABNORMAL HIGH (ref 0.00–0.50)

## 2021-02-23 LAB — CBC WITH DIFFERENTIAL/PLATELET
Abs Immature Granulocytes: 0.03 10*3/uL (ref 0.00–0.07)
Basophils Absolute: 0 10*3/uL (ref 0.0–0.1)
Basophils Relative: 0 %
Eosinophils Absolute: 0 10*3/uL (ref 0.0–0.5)
Eosinophils Relative: 0 %
HCT: 39 % (ref 39.0–52.0)
Hemoglobin: 12.8 g/dL — ABNORMAL LOW (ref 13.0–17.0)
Immature Granulocytes: 1 %
Lymphocytes Relative: 12 %
Lymphs Abs: 0.8 10*3/uL (ref 0.7–4.0)
MCH: 31 pg (ref 26.0–34.0)
MCHC: 32.8 g/dL (ref 30.0–36.0)
MCV: 94.4 fL (ref 80.0–100.0)
Monocytes Absolute: 0.6 10*3/uL (ref 0.1–1.0)
Monocytes Relative: 10 %
Neutro Abs: 5 10*3/uL (ref 1.7–7.7)
Neutrophils Relative %: 77 %
Platelets: 164 10*3/uL (ref 150–400)
RBC: 4.13 MIL/uL — ABNORMAL LOW (ref 4.22–5.81)
RDW: 13.3 % (ref 11.5–15.5)
WBC: 6.4 10*3/uL (ref 4.0–10.5)
nRBC: 0 % (ref 0.0–0.2)

## 2021-02-23 LAB — FERRITIN: Ferritin: 44 ng/mL (ref 24–336)

## 2021-02-23 LAB — MAGNESIUM: Magnesium: 1.8 mg/dL (ref 1.7–2.4)

## 2021-02-23 MED ORDER — FREE WATER
100.0000 mL | Status: DC
  Administered 2021-02-23 – 2021-03-01 (×23): 100 mL

## 2021-02-23 MED ORDER — VITAL HIGH PROTEIN PO LIQD: 1000.0000 mL | ORAL | Status: DC

## 2021-02-23 MED ORDER — PROSOURCE TF PO LIQD
45.0000 mL | Freq: Two times a day (BID) | ORAL | Status: DC
  Administered 2021-02-23 – 2021-03-01 (×8): 45 mL
  Filled 2021-02-23 (×12): qty 45

## 2021-02-23 MED ORDER — OSMOLITE 1.2 CAL PO LIQD
1000.0000 mL | ORAL | Status: AC
  Administered 2021-02-23 – 2021-02-28 (×5): 1000 mL
  Filled 2021-02-23 (×10): qty 1000

## 2021-02-23 NOTE — Progress Notes (Signed)
  Speech Language Pathology Treatment: Dysphagia  Patient Details Name: Stanley Gilbert MRN: [id number] DOB: [birth year] Today's Date: 02/23/2021 Time: [id number] SLP Time Calculation (min) (ACUTE ONLY): 20 min  Assessment / Plan / Recommendation Clinical Impression  Patient seen to address dysphagia goals and determine readiness for PO's. SLP spoke with patient's wife prior to going in his room. She reported that prior to 8/11, patient was eating "like a horse". She told SLP that she had long ago made a promise not to put him in a SNF and this has not changed. She did state she would be ok with a feeding tube "in the nose" but not one "in his stomach" (referring to PEG). When SLP entered room, patient had eyes closed but was able to be aroused easily. He remained awake but as compared to visit with this SLP on 8/13, he is less participatory and not following even the limited commands he was. He was resistant to oral care but SLP was able to provide some oral suction to clear t race to minimal oral secretions. Patient did exhibit some bilabial movements during oral preparatory phase of swallow and was able to start to form lips around cup. He accepted very small cup sips of honey thick liquids. Swallow initiation did occur but not consistently. Oral and pharyngeal phases of swallow continue to be impacted by patient's significantly impaired cognitive function. He continues to not be safe for any PO's.    HPI HPI: Patient is an 87 y.o. male with PMH: dementia and type 2 diabetes mellitus who presented to ER from home for evaluation of fatigue, general weakness, anorexia, increased confusion, and fevers. In ED, head CT negative for acute findings, urinalysis not suggestive of infection and positive COVID-19 PCR. Per wife report in ED, she has been having difficulty getting patient to eat anything for past two days. CXR revealed very mild right basilar atelectasis or early infiltrate.      SLP Plan  Continue  with current plan of care       Recommendations  Diet recommendations: NPO Medication Administration: Via alternative means                Oral Care Recommendations: Oral care QID;Staff/trained caregiver to provide oral care Follow up Recommendations: None SLP Visit Diagnosis: Dysphagia, unspecified (R13.10) Plan: Continue with current plan of care       GO                Sonia Baller, MA, CCC-SLP Speech Therapy

## 2021-02-23 NOTE — Progress Notes (Signed)
[facility] [place] [address] [facility]) Hospital Liaison note:  This patient is currently enrolled in [facility] outpatient-based Palliative Care. Will continue to follow for disposition.  Please call with any outpatient palliative questions or concerns.  Thank you, Lorelee Market, LPN [facility] Liaison [phone]

## 2021-02-23 NOTE — Progress Notes (Addendum)
PROGRESS NOTE    Stanley Gilbert  [id number] DOB: 09/08/1930 DOA: 02/28/2021 PCP: Stanley Cruel, MD   Brief Narrative: 87 year old male with history of dementia type 2 diabetes admitted with generalized weakness anorexia increased confusion and fever.  Patient lives with his wife for 59 years at home.  Per his wife prior to admission he was able to ambulate in the house, feed himself and use the bathroom on his own.   He was found to be COVID-positive.  Head CT shows no acute findings.  Patient has been having decreased p.o. intake for 2 days prior to admission.  She is trying to get home health at home so he can she can take him home.  She wants him to eat again.  He was seen by speech therapy and he is a high aspiration risk and has recommended NPO.  Wife requesting to have PEG tube placed for nutrition so she can take him home.  I have consulted palliative care.  Wife reports that she has promised her husband that she will never put him in the nursing home and that she will take care of him at home till he passes away. She is 87 years old and she is tired and she is trying to get help at home to help her with taking care of him.  She has a daughter who lives in the state.  Assessment & Plan:   Principal Problem:   Acute encephalopathy Active Problems:   Type 2 diabetes mellitus with hypoglycemia without coma (HCC)   Acute COVID-19   Dementia (HCC)   Pressure injury of skin  #1 covid pneumonia-he is not in any respiratory distress,on RA. Continue Remdesvir, supportive care nebulizer as needed. His chest x-ray shows some left basilar infiltrates I have not started him on antibiotics as this is more suggestive of COVID. He has a normal white count his CRP is 4.8 ferritin is 44 and D-dimer is 0.73.  #2 hyponatremia/hypokalemia/hypomagnesemia-replete lytes, fluids were switched from D5 to normal saline yesterday.  Follow-up BMP in AM.   K is 4.1 and mag is 1.8.  #3 type 2 dm CBG  (last 3) he is n.p.o. due to high aspiration risk on NS monitor blood sugar closely.  Hold glimepiride.  Will consult IR per wife's request for PEG tube. Recent Labs    02/23/21 0420 02/23/21 0811 02/23/21 1217  GLUCAP 107* 126* 135*    #4 history of dementia patient lives at home with his wife who is 8 years old been married for 50 years.  Per her at baseline patient was able to ambulate in the house feed himself use the restroom by himself still he was admitted to the hospital.  #5 goals of care patient is DNR however his wife wants [place] to put a NG  tube in him and nourished him so she can take him home.  I have consulted palliative care.  She does not want PEG tube she does not want anything permanent.  #6 high aspiration risk patient n.p.o. continue NS for now.   Pressure Injury 02/21/21 Buttocks Left Stage 2 -  Partial thickness loss of dermis presenting as a shallow open injury with a red, pink wound bed without slough. (Active)  02/21/21 1700  Location: Buttocks  Location Orientation: Left  Staging: Stage 2 -  Partial thickness loss of dermis presenting as a shallow open injury with a red, pink wound bed without slough.  Wound Description (Comments):   Present on Admission:  Yes    Estimated body mass index is 19.56 kg/m as calculated from the following:   Height as of this encounter: '5\' 11"'$  (1.803 m).   Weight as of this encounter: 63.6 kg.  DVT prophylaxis: Lovenox  code Status: DO NOT RESUSCITATE  family Communication: Discussed with his wife [name] on the phone  disposition Plan:  Status is: Inpatient  Remains inpatient appropriate because:Persistent severe electrolyte disturbances  Dispo: The patient is from: Home              Anticipated d/c is to: Home              Patient currently is not medically stable to d/c.   Difficult to place patient No   Consultants:  none  Procedures:none  Antimicrobials:none   Subjective: Patient is resting in bed.  He does  not respond to any questions or commands.  When I called his name loudly looked at me and that is all the response I got from him.  His oral mucosa appears very dry he has normal saline running at 75 cc an hour.  He has been n.p.o. due to high aspiration risk.   Objective: Vitals:   02/22/21 1736 02/22/21 2028 02/23/21 0418 02/23/21 0611  BP: (!) 165/93 115/85 (!) 116/54 (!) 144/58  Pulse: 75 68 60   Resp: '18 18 18   '$ Temp: 97.9 F (36.6 C) 98.9 F (37.2 C) 97.8 F (36.6 C)   TempSrc: Axillary Oral Oral   SpO2: 98% 95% 97%   Weight:    63.6 kg  Height:    '5\' 11"'$  (1.803 m)    Intake/Output Summary (Last 24 hours) at 02/23/2021 1415 Last data filed at 02/23/2021 1005 Gross per 24 hour  Intake 1506.82 ml  Output 1575 ml  Net -68.18 ml    Filed Weights   02/21/21 2010 02/23/21 0611  Weight: 63.6 kg 63.6 kg    Examination:  General exam: Frail chronically ill looking male appears sick Respiratory system: Clear to auscultation. Respiratory effort normal. Cardiovascular system: S1 & S2 heard, RRR. No JVD, murmurs, rubs, gallops or clicks. No pedal edema. Gastrointestinal system: Abdomen is nondistended, soft and nontender. No organomegaly or masses felt. Normal bowel sounds heard. Central nervous system: Confused not following commands or answering questions appropriately.  No gross focal deficits. Extremities: Symmetric 5 x 5 power. Skin: No rashes, lesions or ulcers Psychiatry: Confused   Data Reviewed: I have personally reviewed following labs and imaging studies  CBC: Recent Labs  Lab 02/19/21 2200 02/12/2021 2330 02/22/21 0332 02/23/21 0319  WBC 6.1 5.9 5.0 6.4  NEUTROABS 4.2 3.7 3.6 5.0  HGB 11.1* 11.7* 12.4* 12.8*  HCT 34.0* 36.1* 36.8* 39.0  MCV 98.0 96.8 93.9 94.4  PLT 170 162 168 [id number]    Basic Metabolic Panel: Recent Labs  Lab 02/19/21 2200 03/02/2021 2330 02/22/21 0332 02/23/21 0319  NA 137 136 134* 134*  K 4.0 3.5 3.2* 4.1  CL 101 100 98 101  CO2 '27  26 25 26  '$ GLUCOSE 104* 63* 159* 118*  BUN '17 12 13 11  '$ CREATININE 0.73 0.69 0.59* 0.59*  CALCIUM 8.1* 8.2* 7.9* 7.9*  MG  --   --  1.6* 1.8  PHOS  --   --  3.5  --     GFR: Estimated Creatinine Clearance: 57.4 mL/min (A) (by C-G formula based on SCr of 0.59 mg/dL (L)). Liver Function Tests: Recent Labs  Lab 02/19/21 2200 02/27/2021 2330 02/22/21 0332 02/23/21  0319  AST '19 26 29 31  '$ ALT '12 17 21 23  '$ ALKPHOS 56 49 54 52  BILITOT 0.6 0.5 0.7 0.8  PROT 6.2* 5.9* 6.2* 6.1*  ALBUMIN 3.3* 3.2* 3.3* 3.1*    No results for input(s): LIPASE, AMYLASE in the last 168 hours. Recent Labs  Lab 02/19/21 2200  AMMONIA 10    Coagulation Profile: Recent Labs  Lab 02/19/21 2200  INR 1.0    Cardiac Enzymes: No results for input(s): CKTOTAL, CKMB, CKMBINDEX, TROPONINI in the last 168 hours. BNP (last 3 results) No results for input(s): PROBNP in the last 8760 hours. HbA1C: Recent Labs    02/21/21 0542  HGBA1C 7.3*    CBG: Recent Labs  Lab 02/22/21 2123 02/23/21 0020 02/23/21 0420 02/23/21 0811 02/23/21 1217  GLUCAP 106* 104* 107* 126* 135*    Lipid Profile: No results for input(s): CHOL, HDL, LDLCALC, TRIG, CHOLHDL, LDLDIRECT in the last 72 hours. Thyroid Function Tests: No results for input(s): TSH, T4TOTAL, FREET4, T3FREE, THYROIDAB in the last 72 hours. Anemia Panel: Recent Labs    02/22/21 0332 02/23/21 0319  FERRITIN 34 44    Sepsis Labs: Recent Labs  Lab 02/19/21 2200 02/15/2021 2330 02/21/21 0542  PROCALCITON  --   --  <0.10  LATICACIDVEN 2.6* 1.5  --      Recent Results (from the past 240 hour(s))  Urine Culture     Status: Abnormal   Collection Time: 02/19/21  9:45 PM   Specimen: In/Out Cath Urine  Result Value Ref Range Status   Specimen Description   Final    IN/OUT CATH URINE Performed at [facility], [place] [address] [place], [place] [address]    Special Requests   Final    NONE Performed at [facility], [place] [address], [place], [place] [address]    Culture MULTIPLE SPECIES PRESENT, SUGGEST RECOLLECTION (A)  Final   Report Status 02/21/2021 FINAL  Final  Resp Panel by RT-PCR (Flu A&B, Covid) Nasopharyngeal Swab     Status: Abnormal   Collection Time: 02/19/21  9:46 PM   Specimen: Nasopharyngeal Swab; Nasopharyngeal(NP) swabs in vial transport medium  Result Value Ref Range Status   SARS Coronavirus 2 by RT PCR POSITIVE (A) NEGATIVE Final    Comment: RESULT CALLED TO, READ BACK BY AND VERIFIED WITH: Marina Gravel @ [phone] ON 02/18/2021 C VARNER (NOTE) SARS-CoV-2 target nucleic acids are DETECTED.  The SARS-CoV-2 RNA is generally detectable in upper respiratory specimens during the acute phase of infection. Positive results are indicative of the presence of the identified virus, but do not rule out bacterial infection or co-infection with other pathogens not detected by the test. Clinical correlation with patient history and other diagnostic information is necessary to determine patient infection status. The expected result is Negative.  Fact Sheet for Patients: EntrepreneurPulse.com.au  Fact Sheet for Healthcare Providers: IncredibleEmployment.be  This test is not yet approved or cleared by the [place] FDA and  has been authorized for detection and/or diagnosis of SARS-CoV-2 by FDA under an Emergency Use Authorization (EUA).  This EUA will remain in effect (meaning this te st can be used) for the duration of  the COVID-19 declaration under Section 564(b)(1) of the Act, 21 U.S.C. section 360bbb-3(b)(1), unless the authorization is terminated or revoked sooner.     Influenza A by PCR NEGATIVE NEGATIVE Final   Influenza B by PCR NEGATIVE NEGATIVE Final    Comment: (NOTE) The Xpert Xpress SARS-CoV-2/FLU/RSV plus  assay is intended as an aid in the diagnosis of influenza from Nasopharyngeal swab specimens and should not be used as a  sole basis for treatment. Nasal washings and aspirates are unacceptable for Xpert Xpress SARS-CoV-2/FLU/RSV testing.  Fact Sheet for Patients: EntrepreneurPulse.com.au  Fact Sheet for Healthcare Providers: IncredibleEmployment.be  This test is not yet approved or cleared by the [place] FDA and has been authorized for detection and/or diagnosis of SARS-CoV-2 by FDA under an Emergency Use Authorization (EUA). This EUA will remain in effect (meaning this test can be used) for the duration of the COVID-19 declaration under Section 564(b)(1) of the Act, 21 U.S.C. section 360bbb-3(b)(1), unless the authorization is terminated or revoked.  Performed at [facility], [place] [address], [place], [place] [address]   Blood Culture (routine x 2)     Status: None (Preliminary result)   Collection Time: 02/19/21  9:55 PM   Specimen: BLOOD  Result Value Ref Range Status   Specimen Description   Final    BLOOD BLOOD RIGHT FOREARM Performed at [place] [address], [place], [place] [address]    Special Requests   Final    BOTTLES DRAWN AEROBIC AND ANAEROBIC Blood Culture adequate volume Performed at [place] [address], [place], [place] [address]    Culture   Final    NO GROWTH 3 DAYS Performed at [facility], [place] [address], [place], [place] [address]    Report Status PENDING  Incomplete  Blood Culture (routine x 2)     Status: None (Preliminary result)   Collection Time: 02/19/21 10:10 PM   Specimen: BLOOD  Result Value Ref Range Status   Specimen Description   Final    BLOOD RIGHT ANTECUBITAL Performed at [place] [address], [place], [place] [address]    Special Requests   Final    BOTTLES DRAWN AEROBIC AND ANAEROBIC Blood Culture adequate volume Performed at [place] [address], [place], [place] [address]     Culture   Final    NO GROWTH 3 DAYS Performed at [facility], [place] [address] [place], [place] [address]    Report Status PENDING  Incomplete          Radiology Studies: No results found.      Scheduled Meds:  chlorhexidine  15 mL Mouth Rinse BID   enoxaparin (LOVENOX) injection  40 mg Subcutaneous Q24H   insulin aspart  0-6 Units Subcutaneous Q4H   mouth rinse  15 mL Mouth Rinse q12n4p   Continuous Infusions:  sodium chloride 75 mL/hr at 02/23/21 0409   remdesivir 100 mg in NS 100 mL 100 mg (02/23/21 1007)     LOS: 2 days    Time spent: 40 min    Georgette Shell, MD  02/23/2021, 2:15 PM

## 2021-02-23 NOTE — Progress Notes (Addendum)
Initial Nutrition Assessment  DOCUMENTATION CODES:   Not applicable  INTERVENTION:  - will monitor for outcome of Palliative Care meeting. - pending small bore (10 F) NGT placement.  - consult for TF initiation and management--will order Osmolite 1.2 @ 20 ml/hr to advance by 10 ml every 8 hours to reach goal rate of 60 ml/hr with 45 ml Prosource TF BID and 100 ml free water every 4 hours. - at goal rate, this regimen will provide 1808 kcal, 102 grams protein, and 1781 ml free water.  Monitor magnesium, potassium, and phosphorus daily for at least 3 days, MD to replete as needed, as pt is at risk for refeeding syndrome given hx of dementia with poor nutrition intake PTA.   NUTRITION DIAGNOSIS:   Increased nutrient needs related to acute illness, catabolic illness ([id number] infection) as evidenced by estimated needs.  GOAL:   Patient will meet greater than or equal to 90% of their needs  MONITOR:   Weight trends, Labs, Skin  REASON FOR ASSESSMENT:   Malnutrition Screening Tool, Consult  ASSESSMENT:   87 y.o. male with medical history of dementia (a/o to person and place at baseline) and type 2 DM. He presented to the ED due to severe fatigue, general weakness, anorexia, increased confusion, and fevers. Patient lives with his wife who prepares his meals but he is normally able to feed himself, ambulate unassisted, and use the bathroom on his own. He became more confused 2 days PTA. He was found to be COVID-19 positive in the ED. Patient's wife has been trying to arrange home health and was encouraged to consider having him placed in a nursing facility, but is adamant that she will not do that.  He has been NPO since admission on 8/13. SLP evaluated patient earlier today and recommended patient remain NPO.   He has not been seen by a [place] RD at any time in the past.   Weight today and on 8/13 was 140 lb. PTA, the most recently documented weight was 159 lb on 03/20/19. This  indicates 19 lb weight loss (12% body weight) in ~2 years; not significant for time frame but unable to determine if weight loss was more acute.   Mild pitting edema to BLE documented in the edema section of flow sheet.   Per notes: - COVID-19 PNA - mild hyponatremia - he is DNR but wife desires PEG placement and for him to d/c home--Palliative Care consult pending - high aspiration risk   Labs reviewed; CBGs: 104, 107, 126, 135 mg/dl, Na: 134 mmol/l, creatinine: 0.59 mg/dl, Ca: 7.9 mg/dl. Medications reviewed; sliding scale novolog, 10 mEq IV KCl x4 runs 8/14, 100 mg IV remdesivir x1 dose/day 8/14-8/17. IVF; NS @ 75 ml/hr.     NUTRITION - FOCUSED PHYSICAL EXAM:  Unable to complete at this time.  Diet Order:   Diet Order             Diet NPO time specified  Diet effective now                   EDUCATION NEEDS:   No education needs have been identified at this time  Skin:  Skin Assessment: Skin Integrity Issues: Skin Integrity Issues:: Stage II Stage II: L buttocks  Last BM:  8/15 (type 7 x1)  Height:   Ht Readings from Last 1 Encounters:  02/23/21 '5\' 11"'$  (1.803 m)    Weight:   Wt Readings from Last 1 Encounters:  02/23/21 63.6 kg  Estimated Nutritional Needs:  Kcal:  1800-2000 kcal Protein:  90-105 grams Fluid:  >/= 2.2 L/day      Jarome Matin, MS, RD, LDN, CNSC Inpatient Clinical Dietitian RD pager # available in [facility]  After hours/weekend pager # available in [facility]

## 2021-02-24 DIAGNOSIS — R633 Feeding difficulties, unspecified: Secondary | ICD-10-CM | POA: Diagnosis not present

## 2021-02-24 DIAGNOSIS — Z515 Encounter for palliative care: Secondary | ICD-10-CM

## 2021-02-24 DIAGNOSIS — Z7189 Other specified counseling: Secondary | ICD-10-CM

## 2021-02-24 DIAGNOSIS — G9340 Encephalopathy, unspecified: Secondary | ICD-10-CM | POA: Diagnosis not present

## 2021-02-24 DIAGNOSIS — R531 Weakness: Secondary | ICD-10-CM | POA: Diagnosis not present

## 2021-02-24 LAB — CBC WITH DIFFERENTIAL/PLATELET
Abs Immature Granulocytes: 0.03 10*3/uL (ref 0.00–0.07)
Basophils Absolute: 0 10*3/uL (ref 0.0–0.1)
Basophils Relative: 0 %
Eosinophils Absolute: 0 10*3/uL (ref 0.0–0.5)
Eosinophils Relative: 0 %
HCT: 36.8 % — ABNORMAL LOW (ref 39.0–52.0)
Hemoglobin: 12.4 g/dL — ABNORMAL LOW (ref 13.0–17.0)
Immature Granulocytes: 0 %
Lymphocytes Relative: 13 %
Lymphs Abs: 1.1 10*3/uL (ref 0.7–4.0)
MCH: 31.5 pg (ref 26.0–34.0)
MCHC: 33.7 g/dL (ref 30.0–36.0)
MCV: 93.4 fL (ref 80.0–100.0)
Monocytes Absolute: 0.6 10*3/uL (ref 0.1–1.0)
Monocytes Relative: 7 %
Neutro Abs: 7.2 10*3/uL (ref 1.7–7.7)
Neutrophils Relative %: 80 %
Platelets: 156 10*3/uL (ref 150–400)
RBC: 3.94 MIL/uL — ABNORMAL LOW (ref 4.22–5.81)
RDW: 13.2 % (ref 11.5–15.5)
WBC: 8.9 10*3/uL (ref 4.0–10.5)
nRBC: 0 % (ref 0.0–0.2)

## 2021-02-24 LAB — GLUCOSE, CAPILLARY
Glucose-Capillary: 187 mg/dL — ABNORMAL HIGH (ref 70–99)
Glucose-Capillary: 191 mg/dL — ABNORMAL HIGH (ref 70–99)
Glucose-Capillary: 207 mg/dL — ABNORMAL HIGH (ref 70–99)
Glucose-Capillary: 223 mg/dL — ABNORMAL HIGH (ref 70–99)
Glucose-Capillary: 228 mg/dL — ABNORMAL HIGH (ref 70–99)
Glucose-Capillary: 258 mg/dL — ABNORMAL HIGH (ref 70–99)

## 2021-02-24 LAB — PHOSPHORUS
Phosphorus: 2.4 mg/dL — ABNORMAL LOW (ref 2.5–4.6)
Phosphorus: 1.2 mg/dL — ABNORMAL LOW (ref 2.5–4.6)

## 2021-02-24 LAB — COMPREHENSIVE METABOLIC PANEL
ALT: 23 U/L (ref 0–44)
AST: 26 U/L (ref 15–41)
Albumin: 2.8 g/dL — ABNORMAL LOW (ref 3.5–5.0)
Alkaline Phosphatase: 45 U/L (ref 38–126)
Anion gap: 10 (ref 5–15)
BUN: 16 mg/dL (ref 8–23)
CO2: 23 mmol/L (ref 22–32)
Calcium: 7.9 mg/dL — ABNORMAL LOW (ref 8.9–10.3)
Chloride: 100 mmol/L (ref 98–111)
Creatinine, Ser: 0.6 mg/dL — ABNORMAL LOW (ref 0.61–1.24)
GFR, Estimated: >60 mL/min (ref >60)
Glucose, Bld: 215 mg/dL — ABNORMAL HIGH (ref 70–99)
Potassium: 3.2 mmol/L — ABNORMAL LOW (ref 3.5–5.1)
Sodium: 133 mmol/L — ABNORMAL LOW (ref 135–145)
Total Bilirubin: 1 mg/dL (ref 0.3–1.2)
Total Protein: 5.6 g/dL — ABNORMAL LOW (ref 6.5–8.1)

## 2021-02-24 LAB — MAGNESIUM: Magnesium: 1.6 mg/dL — ABNORMAL LOW (ref 1.7–2.4)

## 2021-02-24 LAB — D-DIMER, QUANTITATIVE: D-Dimer, Quant: 0.79 ug/mL-FEU — ABNORMAL HIGH (ref 0.00–0.50)

## 2021-02-24 LAB — FERRITIN: Ferritin: 78 ng/mL (ref 24–336)

## 2021-02-24 LAB — C-REACTIVE PROTEIN: CRP: 13.1 mg/dL — ABNORMAL HIGH (ref <1.0)

## 2021-02-24 MED ORDER — POTASSIUM CHLORIDE 10 MEQ/100ML IV SOLN
10.0000 meq | INTRAVENOUS | Status: AC
  Administered 2021-02-24 (×3): 10 meq via INTRAVENOUS
  Filled 2021-02-24 (×3): qty 100

## 2021-02-24 MED ORDER — GUAIFENESIN-DM 100-10 MG/5ML PO SYRP
10.0000 mL | ORAL_SOLUTION | ORAL | Status: DC | PRN
  Administered 2021-03-02 – 2021-03-03 (×2): 10 mL
  Filled 2021-02-24 (×2): qty 10

## 2021-02-24 MED ORDER — ACETAMINOPHEN 325 MG PO TABS
650.0000 mg | ORAL_TABLET | Freq: Four times a day (QID) | ORAL | Status: DC | PRN
  Administered 2021-02-25 – 2021-03-03 (×5): 650 mg
  Filled 2021-02-24 (×5): qty 2

## 2021-02-24 MED ORDER — DOCUSATE SODIUM 50 MG/5ML PO LIQD
100.0000 mg | Freq: Every evening | ORAL | Status: DC | PRN
  Administered 2021-03-03: 100 mg
  Filled 2021-02-24 (×3): qty 10

## 2021-02-24 MED ORDER — SENNOSIDES 8.8 MG/5ML PO SYRP
5.0000 mL | ORAL_SOLUTION | Freq: Every evening | ORAL | Status: DC | PRN
  Filled 2021-02-24: qty 5

## 2021-02-24 MED ORDER — ONDANSETRON HCL 4 MG/2ML IJ SOLN: 4.0000 mg | Freq: Four times a day (QID) | INTRAMUSCULAR | Status: DC | PRN

## 2021-02-24 MED ORDER — INSULIN GLARGINE-YFGN 100 UNIT/ML ~~LOC~~ SOLN
6.0000 [IU] | Freq: Every day | SUBCUTANEOUS | Status: DC
  Administered 2021-02-24: 6 [IU] via SUBCUTANEOUS
  Filled 2021-02-24: qty 0.06

## 2021-02-24 MED ORDER — MAGNESIUM SULFATE 2 GM/50ML IV SOLN
2.0000 g | Freq: Once | INTRAVENOUS | Status: AC
  Administered 2021-02-24: 2 g via INTRAVENOUS
  Filled 2021-02-24: qty 50

## 2021-02-24 MED ORDER — INSULIN ASPART 100 UNIT/ML IJ SOLN
0.0000 [IU] | Freq: Three times a day (TID) | INTRAMUSCULAR | Status: DC
  Administered 2021-02-24: 1 [IU] via SUBCUTANEOUS
  Administered 2021-02-24: 2 [IU] via SUBCUTANEOUS

## 2021-02-24 MED ORDER — ONDANSETRON HCL 4 MG PO TABS: 4.0000 mg | ORAL_TABLET | Freq: Four times a day (QID) | ORAL | Status: DC | PRN

## 2021-02-24 NOTE — Progress Notes (Signed)
Report given to Baxter Hire, Therapist, sports. She will assume care of this patients.

## 2021-02-24 NOTE — Progress Notes (Signed)
Daily Progress Note   Patient Name: Stanley Gilbert       Date: 02/24/2021 DOB: April 16, 1931  Age: 87 y.o. MRN#: [id number] Attending Physician: Georgette Shell, MD Primary Care Physician: [name], MD Admit Date: 03/07/2021  Reason for Consultation/Follow-up: Establishing goals of care  Subjective: Patient is resting in bed.  His wife is present at bedside.  The patient wincing/grimacing at times.  Does not open eyes does not verbalize or follow commands.  Has NG tube.  Length of Stay: 3  Current Medications: Scheduled Meds:   chlorhexidine  15 mL Mouth Rinse BID   enoxaparin (LOVENOX) injection  40 mg Subcutaneous Q24H   feeding supplement (PROSource TF)  45 mL Per Tube BID   free water  100 mL Per Tube Q4H   insulin aspart  0-6 Units Subcutaneous TID WC   insulin glargine-yfgn  6 Units Subcutaneous QHS   mouth rinse  15 mL Mouth Rinse q12n4p    Continuous Infusions:  sodium chloride 75 mL/hr at 02/23/21 2149   feeding supplement (OSMOLITE 1.2 CAL) 60 mL/hr at 02/24/21 0329   potassium chloride 10 mEq (02/24/21 1125)   remdesivir 100 mg in NS 100 mL 100 mg (02/24/21 0829)    PRN Meds: acetaminophen, docusate **AND** sennosides, guaiFENesin-dextromethorphan, hydrALAZINE, lip balm, ondansetron **OR** ondansetron (ZOFRAN) IV  Physical Exam         Appears frail chronically ill Regular work of breathing Has NG tube in No edema Does not follow commands  Vital Signs: BP (!) 133/54   Pulse 79   Temp 99.5 F (37.5 C) (Rectal)   Resp 19   Ht _0  (1.803 m)   Wt 63.6 kg   SpO2 95%   BMI 19.56 kg/m  SpO2: SpO2: 95 % O2 Device: O2 Device: Room Air O2 Flow Rate:    Intake/output summary:  Intake/Output Summary (Last 24 hours) at 02/24/2021 1231 Last data  filed at 02/24/2021 1009 Gross per 24 hour  Intake --  Output 1100 ml  Net -1100 ml   LBM: Last BM Date: 02/23/21 Baseline Weight: Weight: 63.6 kg Most recent weight: Weight: 63.6 kg Palliative performance scale 30%.      Palliative Assessment/Data:    Flowsheet Rows    Flowsheet Row Most Recent Value  Intake Tab   Referral  Department Hospitalist  Unit at Time of Referral Med/Surg Unit  Palliative Care Primary Diagnosis Sepsis/Infectious Disease  Date Notified 02/22/21  Reason for referral Clarify Goals of Care  Date of Admission 02/18/2021  Date first seen by Palliative Care 02/23/21  # of days Palliative referral response time 1 Day(s)  # of days IP prior to Palliative referral 2  Clinical Assessment   Palliative Performance Scale Score 10%  Psychosocial & Spiritual Assessment   Palliative Care Outcomes   Patient/Family meeting held? Yes  Who was at the meeting? Wife       Patient Active Problem List   Diagnosis Date Noted   Pressure injury of skin 02/23/2021   Acute COVID-19 02/21/2021   Dementia ([place])    Acute encephalopathy    Type 2 diabetes mellitus with hypoglycemia without coma ([place]) 07/27/2013   Psoriasis and similar disorder 10/23/2012    Palliative Care Assessment & Plan   Patient Profile:    Assessment: 87 year old gentleman who lives at home with his wife with a history of dementia admitted with generalized weakness anorexia increasing confusion and fever.  Patient admitted to hospital medicine service for COVID-pneumonia, electrolyte abnormalities and hospital course complicated by ongoing dysphagia.  Patient now on tube feeds via NG tube.  Palliative services following for goals of care discussions.  Recommendations/Plan: I met with patient's wife [name] at bedside.  She remains focused on continuing efforts at nutrition and remains keen on ensuring patient's stabilization/recovery.  At this time, her only request is for the patient to continue  current mode of care as well as for her to have as many services as possible when the patient is stable enough to be discharged home.  She states that patient's baseline was such that he was able to ambulate by himself and that he had a good appetite.  She is hopeful for stabilization/recovery to his baseline.  At this time, she does not wish to discuss further goals of care and her goals are not palliative in nature.  She is open to considering artificial nutrition via PEG.  Patient filled out a MOST form with his primary care physician and his only request as per his wife was for him to not be placed on a ventilator and not to undergo CPR. Continue current mode of care, palliative will follow peripherally.  Goals of Care and Additional Recommendations: Limitations on Scope of Treatment: Full Scope Treatment  Code Status:    Code Status Orders  (From admission, onward)           Start     Ordered   02/21/21 0142  Do not attempt resuscitation (DNR)  Continuous       Question Answer Comment  In the event of cardiac or respiratory ARREST Do not call a "code blue"   In the event of cardiac or respiratory ARREST Do not perform Intubation, CPR, defibrillation or ACLS   In the event of cardiac or respiratory ARREST Use medication by any route, position, wound care, and other measures to relive pain and suffering. May use oxygen, suction and manual treatment of airway obstruction as needed for comfort.      02/21/21 0143           Code Status History     This patient has a current code status but no historical code status.       Prognosis:  Unable to determine  Discharge Planning: To Be Determined  Care plan was discussed with  patient's wife  [name] who is at bedside this morning.    Thank you for allowing the Palliative Medicine Team to assist in the care of this patient.   Time In:  10 Time Out: 10.35 Total Time 35 Prolonged Time Billed  no       Greater than 50%  of  this time was spent counseling and coordinating care related to the above assessment and plan.  [name], MD  Please contact Palliative Medicine Team phone at [phone] for questions and concerns.

## 2021-02-24 NOTE — Progress Notes (Signed)
PROGRESS NOTE    Stanley Gilbert  [id number] DOB: [birth year] DOA: 02/16/2021 PCP: Stanley Cruel, MD   Brief Narrative: 87 year old male with history of dementia type 2 diabetes admitted with generalized weakness anorexia increased confusion and fever.  Patient lives with his wife for 59 years at home.  Per his wife prior to admission he was able to ambulate in the house, feed himself and use the bathroom on his own.   He was found to be COVID-positive.  Head CT shows no acute findings.  Patient has been having decreased p.o. intake for 2 days prior to admission.  She is trying to get home health at home so he can she can take him home.  She wants him to eat again.  He was seen by speech therapy and he is a high aspiration risk and has recommended NPO.  Wife requesting to have PEG tube placed for nutrition so she can take him home.  I have consulted palliative care.  Wife reports that she has promised her husband that she will never put him in the nursing home and that she will take care of him at home till he passes away. She is 87 years old and she is tired and she is trying to get help at home to help her with taking care of him.  She has a daughter who lives in the state.  Assessment & Plan:   Principal Problem:   Acute encephalopathy Active Problems:   Type 2 diabetes mellitus with hypoglycemia without coma (HCC)   Acute COVID-19   Dementia (HCC)   Pressure injury of skin  #1 covid pneumonia-he is not in any respiratory distress,on RA. He finished his course of Remdesvir, supportive care nebulizer as needed. His chest x-ray shows some left basilar infiltrates I have not started him on antibiotics as this is more suggestive of COVID. He has a normal white count  Inflammatory markers are elevated  #2 hyponatremia/hypokalemia/hypomagnesemia-replete lytes, fluids were switched from D5 to normal saline yesterday.  Follow-up BMP in AM.   K is 3.2 mag is 1.6 sodium is 133.  #3 type 2  dm CBG (last 3)   -he was started on tube feeds yesterday.  Patient now has a small bore NG tube for feeding purposes.  Wife is aware that this is temporary.  Hold glimepiride.  Cover with SSI I will add Lantus today. Recent Labs    02/24/21 0313 02/24/21 0743 02/24/21 1155  GLUCAP 187* 207* 191*    #4 history of dementia patient lives at home with his wife who is 42 years old been married for 39 years.  Per her at baseline patient was able to ambulate in the house feed himself use the restroom by himself still he was admitted to the hospital.  #5 goals of care patient is DNR.  Patient had a small bore NG tube placed on 02/23/2021 for nutrition.  This was a temporary trial as he is n.p.o. and his high aspiration risk.  We will do the trial of tube feeds for couple of days to see if he improves overall.  If he does not improve we will need to discuss with wife again regarding hospice.  However she does not want to hear palliative hospice at this time.  She is very adamant that patient will not go to any nursing home but go home and she can take care of him at home.    #6 high aspiration risk -NG tube placed 02/23/2021  for feeding.  Pressure Injury 02/21/21 Buttocks Left Stage 2 -  Partial thickness loss of dermis presenting as a shallow open injury with a red, pink wound bed without slough. (Active)  02/21/21 1700  Location: Buttocks  Location Orientation: Left  Staging: Stage 2 -  Partial thickness loss of dermis presenting as a shallow open injury with a red, pink wound bed without slough.  Wound Description (Comments):   Present on Admission: Yes    Estimated body mass index is 19.56 kg/m as calculated from the following:   Height as of this encounter: '5\' 11"'$  (1.803 m).   Weight as of this encounter: 63.6 kg.  DVT prophylaxis: Lovenox  code Status: DO NOT RESUSCITATE  family Communication: Discussed with his wife [name] on the phone  disposition Plan:  Status is: Inpatient  Remains  inpatient appropriate because:Persistent severe electrolyte disturbances  Dispo: The patient is from: Home              Anticipated d/c is to: Home              Patient currently is not medically stable to d/c.   Difficult to place patient No   Consultants:  none  Procedures:none  Antimicrobials:none   Subjective: Patient now has an NG tube in place for feeding.  He is resting in bed.  He does not answer any questions or follow any commands he lays in bed with his eyes closed.  When I called his name he does not open his eyes which he did 2 days ago.   Objective: Vitals:   02/24/21 0132 02/24/21 0134 02/24/21 0325 02/24/21 0327  BP: (!) 124/48  (!) 133/54   Pulse: 81  79   Resp: 19     Temp: (!) 100.9 F (38.3 C) (!) 102.1 F (38.9 C) 98.5 F (36.9 C) 99.5 F (37.5 C)  TempSrc: Axillary Rectal Axillary Rectal  SpO2: 92%  95%   Weight:      Height:        Intake/Output Summary (Last 24 hours) at 02/24/2021 1156 Last data filed at 02/24/2021 1009 Gross per 24 hour  Intake --  Output 1100 ml  Net -1100 ml    Filed Weights   02/21/21 2010 02/23/21 0611  Weight: 63.6 kg 63.6 kg    Examination:  General exam: Frail chronically ill looking male appears sick Respiratory system: Clear to auscultation. Respiratory effort normal. Cardiovascular system: S1 & S2 heard, RRR. No JVD, murmurs, rubs, gallops or clicks. No pedal edema. Gastrointestinal system: Abdomen is nondistended, soft and nontender. No organomegaly or masses felt. Normal bowel sounds heard. Central nervous system: Confused not following commands or answering questions appropriately.  No gross focal deficits. Extremities: Symmetric 5 x 5 power. Skin: No rashes, lesions or ulcers Psychiatry: Confused   Data Reviewed: I have personally reviewed following labs and imaging studies  CBC: Recent Labs  Lab 02/19/21 2200 02/19/2021 2330 02/22/21 0332 02/23/21 0319 02/24/21 0521  WBC 6.1 5.9 5.0 6.4 8.9   NEUTROABS 4.2 3.7 3.6 5.0 7.2  HGB 11.1* 11.7* 12.4* 12.8* 12.4*  HCT 34.0* 36.1* 36.8* 39.0 36.8*  MCV 98.0 96.8 93.9 94.4 93.4  PLT 170 162 168 164 [id number]    Basic Metabolic Panel: Recent Labs  Lab 02/19/21 2200 03/06/2021 2330 02/22/21 0332 02/23/21 0319 02/23/21 1626 02/24/21 0521  NA 137 136 134* 134*  --  133*  K 4.0 3.5 3.2* 4.1  --  3.2*  CL 101 100 98 101  --  100  CO2 '27 26 25 26  '$ --  23  GLUCOSE 104* 63* 159* 118*  --  215*  BUN '17 12 13 11  '$ --  16  CREATININE 0.73 0.69 0.59* 0.59*  --  0.60*  CALCIUM 8.1* 8.2* 7.9* 7.9*  --  7.9*  MG  --   --  1.6* 1.8  --  1.6*  PHOS  --   --  3.5 2.7 2.3* 2.4*    GFR: Estimated Creatinine Clearance: 57.4 mL/min (A) (by C-G formula based on SCr of 0.6 mg/dL (L)). Liver Function Tests: Recent Labs  Lab 02/19/21 2200 02/27/2021 2330 02/22/21 0332 02/23/21 0319 02/24/21 0521  AST '19 26 29 31 26  '$ ALT '12 17 21 23 23  '$ ALKPHOS 56 49 54 52 45  BILITOT 0.6 0.5 0.7 0.8 1.0  PROT 6.2* 5.9* 6.2* 6.1* 5.6*  ALBUMIN 3.3* 3.2* 3.3* 3.1* 2.8*    No results for input(s): LIPASE, AMYLASE in the last 168 hours. Recent Labs  Lab 02/19/21 2200  AMMONIA 10    Coagulation Profile: Recent Labs  Lab 02/19/21 2200  INR 1.0    Cardiac Enzymes: No results for input(s): CKTOTAL, CKMB, CKMBINDEX, TROPONINI in the last 168 hours. BNP (last 3 results) No results for input(s): PROBNP in the last 8760 hours. HbA1C: No results for input(s): HGBA1C in the last 72 hours.  CBG: Recent Labs  Lab 02/23/21 1935 02/23/21 2330 02/24/21 0313 02/24/21 0743 02/24/21 1155  GLUCAP 152* 195* 187* 207* 191*    Lipid Profile: No results for input(s): CHOL, HDL, LDLCALC, TRIG, CHOLHDL, LDLDIRECT in the last 72 hours. Thyroid Function Tests: No results for input(s): TSH, T4TOTAL, FREET4, T3FREE, THYROIDAB in the last 72 hours. Anemia Panel: Recent Labs    02/23/21 0319 02/24/21 0521  FERRITIN 44 78    Sepsis Labs: Recent Labs  Lab  02/19/21 2200 02/16/2021 2330 02/21/21 0542  PROCALCITON  --   --  <0.10  LATICACIDVEN 2.6* 1.5  --      Recent Results (from the past 240 hour(s))  Urine Culture     Status: Abnormal   Collection Time: 02/19/21  9:45 PM   Specimen: In/Out Cath Urine  Result Value Ref Range Status   Specimen Description   Final    IN/OUT CATH URINE Performed at [facility], [place] [address], [place], [place] [address]    Special Requests   Final    NONE Performed at [facility], [place] [address] [place], [place] [address]    Culture MULTIPLE SPECIES PRESENT, SUGGEST RECOLLECTION (A)  Final   Report Status 02/21/2021 FINAL  Final  Resp Panel by RT-PCR (Flu A&B, Covid) Nasopharyngeal Swab     Status: Abnormal   Collection Time: 02/19/21  9:46 PM   Specimen: Nasopharyngeal Swab; Nasopharyngeal(NP) swabs in vial transport medium  Result Value Ref Range Status   SARS Coronavirus 2 by RT PCR POSITIVE (A) NEGATIVE Final    Comment: RESULT CALLED TO, READ BACK BY AND VERIFIED WITH: Marina Gravel @ [phone] ON 02/28/2021 C VARNER (NOTE) SARS-CoV-2 target nucleic acids are DETECTED.  The SARS-CoV-2 RNA is generally detectable in upper respiratory specimens during the acute phase of infection. Positive results are indicative of the presence of the identified virus, but do not rule out bacterial infection or co-infection with other pathogens not detected by the test. Clinical correlation with patient history and other diagnostic information is necessary to determine patient infection status. The expected result is  Negative.  Fact Sheet for Patients: EntrepreneurPulse.com.au  Fact Sheet for Healthcare Providers: IncredibleEmployment.be  This test is not yet approved or cleared by the [place] FDA and  has been authorized for detection and/or diagnosis of SARS-CoV-2 by FDA under an Emergency Use Authorization (EUA).  This EUA  will remain in effect (meaning this te st can be used) for the duration of  the COVID-19 declaration under Section 564(b)(1) of the Act, 21 U.S.C. section 360bbb-3(b)(1), unless the authorization is terminated or revoked sooner.     Influenza A by PCR NEGATIVE NEGATIVE Final   Influenza B by PCR NEGATIVE NEGATIVE Final    Comment: (NOTE) The Xpert Xpress SARS-CoV-2/FLU/RSV plus assay is intended as an aid in the diagnosis of influenza from Nasopharyngeal swab specimens and should not be used as a sole basis for treatment. Nasal washings and aspirates are unacceptable for Xpert Xpress SARS-CoV-2/FLU/RSV testing.  Fact Sheet for Patients: EntrepreneurPulse.com.au  Fact Sheet for Healthcare Providers: IncredibleEmployment.be  This test is not yet approved or cleared by the [place] FDA and has been authorized for detection and/or diagnosis of SARS-CoV-2 by FDA under an Emergency Use Authorization (EUA). This EUA will remain in effect (meaning this test can be used) for the duration of the COVID-19 declaration under Section 564(b)(1) of the Act, 21 U.S.C. section 360bbb-3(b)(1), unless the authorization is terminated or revoked.  Performed at [facility], [place] [address], [place], [place] [address]   Blood Culture (routine x 2)     Status: None (Preliminary result)   Collection Time: 02/19/21  9:55 PM   Specimen: BLOOD  Result Value Ref Range Status   Specimen Description   Final    BLOOD BLOOD RIGHT FOREARM Performed at [place] [address] [place], [place] [address]    Special Requests   Final    BOTTLES DRAWN AEROBIC AND ANAEROBIC Blood Culture adequate volume Performed at [place] [address] [place], [place] [address]    Culture   Final    NO GROWTH 3 DAYS Performed at [facility], [place] [address] [place], [place] [address]    Report Status  PENDING  Incomplete  Blood Culture (routine x 2)     Status: None (Preliminary result)   Collection Time: 02/19/21 10:10 PM   Specimen: BLOOD  Result Value Ref Range Status   Specimen Description   Final    BLOOD RIGHT ANTECUBITAL Performed at [place] [address], [place], [place] [address]    Special Requests   Final    BOTTLES DRAWN AEROBIC AND ANAEROBIC Blood Culture adequate volume Performed at [place] [address] [place], [place] [address]    Culture   Final    NO GROWTH 3 DAYS Performed at [facility], [place] [address] [place], [place] [address]    Report Status PENDING  Incomplete          Radiology Studies: DG Abd Portable 1V  Result Date: 02/23/2021 CLINICAL DATA:  NG tube placement. EXAM: PORTABLE ABDOMEN - 1 VIEW COMPARISON:  None. FINDINGS: Tip of the weighted enteric tube is in the left upper quadrant in the region of the mid gastric body. This is not post pyloric. No bowel dilatation in the upper abdomen. IMPRESSION: Tip of the weighted enteric tube below the diaphragm in the stomach, in the region of the mid gastric body. This is not post pyloric. Electronically Signed  By: Keith Rake M.D.   On: 02/23/2021 17:35        Scheduled Meds:  chlorhexidine  15 mL Mouth Rinse BID   enoxaparin (LOVENOX) injection  40 mg Subcutaneous Q24H   feeding supplement (PROSource TF)  45 mL Per Tube BID   free water  100 mL Per Tube Q4H   insulin aspart  0-6 Units Subcutaneous Q4H   mouth rinse  15 mL Mouth Rinse q12n4p   Continuous Infusions:  sodium chloride 75 mL/hr at 02/23/21 2149   feeding supplement (OSMOLITE 1.2 CAL) 60 mL/hr at 02/24/21 0329   potassium chloride 10 mEq (02/24/21 1125)   remdesivir 100 mg in NS 100 mL 100 mg (02/24/21 0829)     LOS: 3 days    Time spent: 40 min  Georgette Shell, MD  02/24/2021, 11:56 AM

## 2021-02-24 NOTE — Consult Note (Signed)
Consultation Note Date: 02/24/2021   Patient Name: Stanley Gilbert  DOB: February 17, 1931  MRN: [id number]  Age / Sex: 87 y.o., male  PCP: [name], MD Referring Physician: Georgette Shell, MD  Reason for Consultation: Establishing goals of care  HPI/Patient Profile: 87 y.o. male  with past medical history of dementia, diabetes, generalized weakness and anorexia admitted on 02/28/2021 with COVID-19 infection.  He has had poor intake and NGT placed for trial of tube feeds.  Palliative consulted for goals of care.  Clinical Assessment and Goals of Care: Palliative care consult received.  Chart reviewed including personal review of pertinent labs and imaging.  I saw and examined Mr. Stanley Gilbert today.  He was lying in bed in no acute distress but did not converse, open eyes, or otherwise interact with me.  I called and was able to reach his wife, [name].  When I first called [name], she was very upset that she states that she was downstairs in the lobby trying to get somebody to bring her up to his room to visit with him.  I went to the lobby and escorted her to the floor.  We discussed her husband's clinical course this admission.  [name] remained upset throughout my encounter with her and tells me that she feels that the hospital is, "trying to kill him."  She is very set on the fact that he has had no significant nutrition for the past several days and states that she does not feel he has any chance of getting better without continued nutrition support.  I attempted to discuss his multiple comorbidities and concern that even with supplemental nutrition via tube, he remains at high risk for continued decompensation and this is a very short-term solution to his nutrition problem.  At this point, however, [name] can only focus on visiting with him this evening and ensuring that tube feeds are started.  SUMMARY OF  RECOMMENDATIONS   - DNR Galvin Proffer -Discussed multiple comorbidities with his wife today.  She is adamant that she believes the reason he is not improving is because of lack of nutrition.  He had smallbore NGT placed today and plan is for trial of tube feeds for couple days to see if this improves his overall status.  I attempted to discuss concern with his wife that he has more problems than just poor nutrition, however, she was not really open to engaging in conversation.  Attempted to clarify limits of care with her, however, she stated that focus needs to be on trying to get nutrition into home rather than, "the hospital trying to kill him." -Palliative care to continue to follow.  Code Status/Advance Care Planning: DNR Psycho-social/Spiritual:  Desire for further Chaplaincy support:Not addressed today Additional Recommendations: Caregiving  Support/Resources  Prognosis:  Guarded  Discharge Planning: To Be Determined      Primary Diagnoses: Present on Admission:  Acute COVID-19  Type 2 diabetes mellitus with hypoglycemia without coma ([place])  Dementia ([place])  Acute encephalopathy   I have reviewed the  medical record, interviewed the patient and family, and examined the patient. The following aspects are pertinent.  Past Medical History:  Diagnosis Date   Dementia ([place])    Diabetes ([place])    Social History   Socioeconomic History   Marital status: Married    Spouse name: Not on file   Number of children: 1   Years of education: Not on file   Highest education level: Some college, no degree  Occupational History   Not on file  Tobacco Use   Smoking status: Never   Smokeless tobacco: Never  Vaping Use   Vaping Use: Never used  Substance and Sexual Activity   Alcohol use: No   Drug use: Not on file   Sexual activity: Not on file  Other Topics Concern   Not on file  Social History Narrative   Not on file   Social Determinants of Health   Financial Resource Strain: Not  on file  Food Insecurity: Not on file  Transportation Needs: Not on file  Physical Activity: Not on file  Stress: Not on file  Social Connections: Not on file   Family History  Problem Relation Age of Onset   Arthritis Other        parent   Lung cancer Other        parent   Heart disease Other        parent   Diabetes Other        parent   Brain cancer Mother        tumor   Heart disease Father    Anxiety disorder Sister    Heart disease Brother    Diabetes Brother    Mental illness Sister    Healthy Daughter    Scheduled Meds:  chlorhexidine  15 mL Mouth Rinse BID   enoxaparin (LOVENOX) injection  40 mg Subcutaneous Q24H   feeding supplement (PROSource TF)  45 mL Per Tube BID   free water  100 mL Per Tube Q4H   insulin aspart  0-6 Units Subcutaneous Q4H   mouth rinse  15 mL Mouth Rinse q12n4p   Continuous Infusions:  sodium chloride 75 mL/hr at 02/23/21 2149   feeding supplement (OSMOLITE 1.2 CAL) 60 mL/hr at 02/24/21 [id number]   remdesivir 100 mg in NS 100 mL 100 mg (02/23/21 1007)   PRN Meds:.acetaminophen, docusate **AND** sennosides, guaiFENesin-dextromethorphan, hydrALAZINE, lip balm, ondansetron **OR** ondansetron (ZOFRAN) IV Medications Prior to Admission:  Prior to Admission medications   Medication Sig Start Date End Date Taking? Authorizing Provider  acetaminophen (TYLENOL) 500 MG tablet Take 500 mg by mouth every 6 (six) hours as needed for mild pain, moderate pain, fever or headache.   Yes [provider]  glimepiride (AMARYL) 4 MG tablet Take 4 mg by mouth daily with breakfast.   Yes [provider]  metFORMIN (GLUCOPHAGE) 1000 MG tablet Take 1,000 mg by mouth 2 (two) times daily. 02/27/20  Yes [provider]  vitamin B-12 (CYANOCOBALAMIN) 500 MCG tablet Take 500 mcg by mouth daily.   Yes [provider]   Allergies  Allergen Reactions   Sitagliptin     Other reaction(s): ABDOMINAL PAIN   Review of Systems Unable to  obtain  Physical Exam General: Frail, chronically ill appearing, does not open eyes or follow commands  HEENT: No bruits, no goiter, no JVD Heart: Regular rate and rhythm. No murmur appreciated. Lungs: Good air movement, clear Abdomen: Soft, nontender, nondistended, positive bowel sounds.   Ext: No  significant edema Skin: Warm and dry Neuro: Confused, does not follow commands   Vital Signs: BP (!) 133/54   Pulse 79   Temp 99.5 F (37.5 C) (Rectal)   Resp 19   Ht '5\' 11"'$  (1.803 m)   Wt 63.6 kg   SpO2 95%   BMI 19.56 kg/m  Pain Scale: PAINAD   Pain Score: 0-No pain   SpO2: SpO2: 95 % O2 Device:SpO2: 95 % O2 Flow Rate: .   IO: Intake/output summary:  Intake/Output Summary (Last 24 hours) at 02/24/2021 0640 Last data filed at 02/24/2021 0033 Gross per 24 hour  Intake --  Output 850 ml  Net -850 ml    LBM: Last BM Date: 02/23/21 Baseline Weight: Weight: 63.6 kg Most recent weight: Weight: 63.6 kg     Palliative Assessment/Data:   Flowsheet Rows    Flowsheet Row Most Recent Value  Intake Tab   Referral Department Hospitalist  Unit at Time of Referral Med/Surg Unit  Palliative Care Primary Diagnosis Sepsis/Infectious Disease  Date Notified 02/22/21  Reason for referral Clarify Goals of Care  Date of Admission 03/06/2021  Date first seen by Palliative Care 02/23/21  # of days Palliative referral response time 1 Day(s)  # of days IP prior to Palliative referral 2  Clinical Assessment   Palliative Performance Scale Score 10%  Psychosocial & Spiritual Assessment   Palliative Care Outcomes   Patient/Family meeting held? Yes  Who was at the meeting? Wife       Time In: 1840 Time Out: 1930 Time Total: 94 Greater than 50%  of this time was spent counseling and coordinating care related to the above assessment and plan.  Signed by: Micheline Rough, MD   Please contact Palliative Medicine Team phone at [phone] for questions and concerns.  For individual  provider: See Shea Evans

## 2021-02-24 NOTE — TOC Progression Note (Signed)
Transition of Care [facility]) - Progression Note    Patient Details  Name: Stanley Gilbert MRN: [id number] Date of Birth: 1931-01-16  Transition of [facility] CM/SW Contact  Cecil Cobbs Phone Number: 02/24/2021, 6:02 PM  Clinical Narrative:     CSW continuing to follow patient's progress throughout discharge planning.  Home health [facility] through [facility], patient's wife still refusing snf for patient.  Expected Discharge Plan: [place] Barriers to Discharge: Continued Medical Work up  Expected Discharge Plan and Services Expected Discharge Plan: [place]   Discharge Planning Services: CM Consult   Living arrangements for the past 2 months: Assisted Living Facility                           HH Arranged: OT, PT, Nurse's Aide, Refused SNF, Social Work [employer] Agency: [place] Date [place]: 02/21/21 Time [place]: 1200 Representative spoke with at [place]: [place] ([place]) Interventions    Readmission Risk Interventions No flowsheet data found.

## 2021-02-25 DIAGNOSIS — F0390 Unspecified dementia without behavioral disturbance: Secondary | ICD-10-CM | POA: Diagnosis not present

## 2021-02-25 DIAGNOSIS — G9340 Encephalopathy, unspecified: Secondary | ICD-10-CM | POA: Diagnosis not present

## 2021-02-25 DIAGNOSIS — Z7189 Other specified counseling: Secondary | ICD-10-CM | POA: Diagnosis not present

## 2021-02-25 DIAGNOSIS — E11649 Type 2 diabetes mellitus with hypoglycemia without coma: Secondary | ICD-10-CM | POA: Diagnosis not present

## 2021-02-25 DIAGNOSIS — U071 COVID-19: Secondary | ICD-10-CM | POA: Diagnosis not present

## 2021-02-25 DIAGNOSIS — R633 Feeding difficulties, unspecified: Secondary | ICD-10-CM | POA: Diagnosis not present

## 2021-02-25 DIAGNOSIS — Z515 Encounter for palliative care: Secondary | ICD-10-CM | POA: Diagnosis not present

## 2021-02-25 DIAGNOSIS — E876 Hypokalemia: Secondary | ICD-10-CM

## 2021-02-25 DIAGNOSIS — R1310 Dysphagia, unspecified: Secondary | ICD-10-CM

## 2021-02-25 LAB — BASIC METABOLIC PANEL
Anion gap: 7 (ref 5–15)
BUN: 15 mg/dL (ref 8–23)
CO2: 25 mmol/L (ref 22–32)
Calcium: 7.6 mg/dL — ABNORMAL LOW (ref 8.9–10.3)
Chloride: 100 mmol/L (ref 98–111)
Creatinine, Ser: 0.56 mg/dL — ABNORMAL LOW (ref 0.61–1.24)
GFR, Estimated: >60 mL/min (ref >60)
Glucose, Bld: 277 mg/dL — ABNORMAL HIGH (ref 70–99)
Potassium: 3.3 mmol/L — ABNORMAL LOW (ref 3.5–5.1)
Sodium: 132 mmol/L — ABNORMAL LOW (ref 135–145)

## 2021-02-25 LAB — CULTURE, BLOOD (ROUTINE X 2)
Culture: NO GROWTH
Culture: NO GROWTH
Special Requests: ADEQUATE
Special Requests: ADEQUATE

## 2021-02-25 LAB — MAGNESIUM: Magnesium: 2 mg/dL (ref 1.7–2.4)

## 2021-02-25 LAB — GLUCOSE, CAPILLARY
Glucose-Capillary: 154 mg/dL — ABNORMAL HIGH (ref 70–99)
Glucose-Capillary: 199 mg/dL — ABNORMAL HIGH (ref 70–99)
Glucose-Capillary: 217 mg/dL — ABNORMAL HIGH (ref 70–99)
Glucose-Capillary: 288 mg/dL — ABNORMAL HIGH (ref 70–99)
Glucose-Capillary: 314 mg/dL — ABNORMAL HIGH (ref 70–99)

## 2021-02-25 LAB — PHOSPHORUS: Phosphorus: 1.4 mg/dL — ABNORMAL LOW (ref 2.5–4.6)

## 2021-02-25 MED ORDER — INSULIN GLARGINE-YFGN 100 UNIT/ML ~~LOC~~ SOLN
10.0000 [IU] | Freq: Every day | SUBCUTANEOUS | Status: DC
  Administered 2021-02-25 – 2021-02-27 (×3): 10 [IU] via SUBCUTANEOUS
  Filled 2021-02-25 (×3): qty 0.1

## 2021-02-25 MED ORDER — INSULIN ASPART 100 UNIT/ML IJ SOLN
0.0000 [IU] | INTRAMUSCULAR | Status: DC
  Administered 2021-02-25: 3 [IU] via SUBCUTANEOUS
  Administered 2021-02-25: 4 [IU] via SUBCUTANEOUS
  Administered 2021-02-25: 2 [IU] via SUBCUTANEOUS
  Administered 2021-02-25 – 2021-02-26 (×2): 1 [IU] via SUBCUTANEOUS

## 2021-02-25 MED ORDER — POTASSIUM CHLORIDE 10 MEQ/100ML IV SOLN
10.0000 meq | INTRAVENOUS | Status: AC
  Administered 2021-02-25 (×4): 10 meq via INTRAVENOUS
  Filled 2021-02-25 (×5): qty 100

## 2021-02-25 MED ORDER — SODIUM PHOSPHATES 45 MMOLE/15ML IV SOLN
20.0000 mmol | Freq: Once | INTRAVENOUS | Status: AC
  Administered 2021-02-25: 20 mmol via INTRAVENOUS
  Filled 2021-02-25: qty 6.67

## 2021-02-25 NOTE — Progress Notes (Signed)
PROGRESS NOTE    Stanley Gilbert  [id number] DOB: 12/04/1930 DOA: 02/26/2021 PCP: [name], MD    Chief Complaint  Patient presents with   Fever   Fatigue   Covid Positive    Brief Narrative:   87 year old male with history of dementia type 2 diabetes admitted with generalized weakness anorexia increased confusion and fever.  Patient lives with his wife for 59 years at home.  Per his wife prior to admission he was able to ambulate in the house, feed himself and use the bathroom on his own.   He was found to be COVID-positive.  Head CT shows no acute findings.  Patient has been having decreased p.o. intake for 2 days prior to admission.  She is trying to get home health at home so he can she can take him home.  She wants him to eat again.  He was seen by speech therapy and he is a high aspiration risk and has recommended NPO.  Wife requesting to have PEG tube placed for nutrition so she can take him home.  I have consulted palliative care.  Wife reports that she has promised her husband that she will never put him in the nursing home and that she will take care of him at home till he passes away. She is 87 years old and she is tired and she is trying to get help at home to help her with taking care of him.  She has a daughter who lives in the state.     Assessment & Plan:   Principal Problem:   Acute encephalopathy Active Problems:   Type 2 diabetes mellitus with hypoglycemia without coma (HCC)   Acute COVID-19   Dementia (HCC)   Pressure injury of skin   Feeding difficulties   Weakness   Palliative care by specialist   Goals of care, counseling/discussion  #1 COVID-pneumonia -Status post full course remdesivir. -Chest x-ray done did show some left basilar infiltrates patient not on antibiotics as likely secondary to COVID-pneumonia. -Supportive care.  2.  Hypokalemia/hypomagnesemia/hyponatremia -Likely secondary to poor oral intake. -Potassium at 3.3, phosphorus  at 1.4, magnesium at 2.0. -Replete potassium and phosphorus. -Repeat labs in the morning.  3.  Diabetes mellitus type 2 -Patient with poor oral intake. -Patient started on tube feeds at family's insistence and patient pulled out NG tube with tube and hand. -Hemoglobin A1c 7.3 (02/21/2021) -CBG 288 this morning -Continue to hold glimepiride. -Continue sliding scale insulin, Lantus at 10 units daily. -Monitor CBGs as patient pulled out NG tube.  4.  History of dementia -Patient with history of dementia, has been declining, lives at home with his wife who is 87 years old and they have been married for 68 years. -Per Dr. Zigmund  in discussion with wife patient at baseline able to ambulate in the house, feed himself, use the restroom until he was recently admitted during this hospitalization. -Patient with poor oral intake, NG tube placed however patient pulled out NG tube. -Palliative care consulted however seems as if family is resistant to hospice at this time or palliation and wanting to take patient home with home health. -Follow.  5.  Goals of care -Patient is DNR, patient with dementia with poor oral intake recently.  Small bore NG tube placed 02/23/2021 for nutrition which was supposed to be a temporary trial while patient was n.p.o. as he is high aspiration risk. -Patient pulled out NG tube. -Monitor over the next 24 hours. -If no significant improvement  will need to discuss with wife regarding reassessment of hospice versus palliation. -It is noted per Dr. Zigmund  that wife does not want to hear palliative or hospice at this time and very adamant that patient will not go to a nursing home but will go home when she can take care of him in his final days.  6.  High risk aspiration/dysphagia -NG tube was placed on 02/23/2021 for feeding however patient seem to have pulled out NG tube today. -We will have SLP to reassess.  7.  Pressure injury, POA Pressure Injury 02/21/21 Buttocks  Left Stage 2 -  Partial thickness loss of dermis presenting as a shallow open injury with a red, pink wound bed without slough. (Active)  02/21/21 1700  Location: Buttocks  Location Orientation: Left  Staging: Stage 2 -  Partial thickness loss of dermis presenting as a shallow open injury with a red, pink wound bed without slough.  Wound Description (Comments):   Present on Admission: Yes         DVT prophylaxis: Lovenox Code Status: DNR Family Communication: No family at bedside Disposition:   Status is: Inpatient  Remains inpatient appropriate because:Persistent severe electrolyte disturbances  Dispo: The patient is from: Home              Anticipated d/c is to: Home              Patient currently is not medically stable to d/c.   Difficult to place patient No       Consultants:  Palliative care: Dr. Domingo Cocking 02/23/2021  Procedures:  CT head 02/19/2021 Chest x-ray 02/19/2021, 02/13/2021 Abdominal films 02/23/2021  Antimicrobials:  IV remdesivir 02/21/2021>>>>> 02/25/2021   Subjective: Resting comfortably.  Holding NG tube in his hand.  Objective: Vitals:   02/25/21 0407 02/25/21 1031 02/25/21 1358 02/25/21 2139  BP: 134/63   134/65  Pulse: 91   92  Resp: 18   20  Temp: 98.3 F (36.8 C) (!) 101.3 F (38.5 C) 99.2 F (37.3 C) 100.3 F (37.9 C)  TempSrc: Oral Axillary Axillary Oral  SpO2: 93%   96%  Weight:      Height:        Intake/Output Summary (Last 24 hours) at 02/25/2021 2240 Last data filed at 02/25/2021 1816 Gross per 24 hour  Intake --  Output 1400 ml  Net -1400 ml   Filed Weights   02/21/21 2010 02/23/21 0611 02/25/21 0355  Weight: 63.6 kg 63.6 kg 67.2 kg    Examination:  General exam: Appears calm and comfortable Respiratory system: Clear to auscultation anterior lung fields.Marland Kitchen Respiratory effort normal. Cardiovascular system: S1 & S2 heard, RRR. No JVD, murmurs, rubs, gallops or clicks. No pedal edema. Gastrointestinal system: Abdomen  is nondistended, soft and nontender. No organomegaly or masses felt. Normal bowel sounds heard. Central nervous system: Resting.. No focal neurological deficits. Extremities: Symmetric 5 x 5 power. Skin: No rashes, lesions or ulcers Psychiatry: Judgement and insight appear normal. Mood & affect appropriate.     Data Reviewed: I have personally reviewed following labs and imaging studies  CBC: Recent Labs  Lab 02/19/21 2200 02/28/2021 2330 02/22/21 0332 02/23/21 0319 02/24/21 0521  WBC 6.1 5.9 5.0 6.4 8.9  NEUTROABS 4.2 3.7 3.6 5.0 7.2  HGB 11.1* 11.7* 12.4* 12.8* 12.4*  HCT 34.0* 36.1* 36.8* 39.0 36.8*  MCV 98.0 96.8 93.9 94.4 93.4  PLT 170 162 168 164 [id number]    Basic Metabolic Panel: Recent Labs  Lab 02/18/2021 2330 02/22/21  [id number] 02/22/21 0332 02/23/21 0319 02/23/21 1626 02/24/21 0521 02/24/21 1628 02/25/21 0337 02/25/21 0340  NA 136 134*  --  134*  --  133*  --   --  132*  K 3.5 3.2*  --  4.1  --  3.2*  --   --  3.3*  CL 100 98  --  101  --  100  --   --  100  CO2 26 25  --  26  --  23  --   --  25  GLUCOSE 63* 159*  --  118*  --  215*  --   --  277*  BUN 12 13  --  11  --  16  --   --  15  CREATININE 0.69 0.59*  --  0.59*  --  0.60*  --   --  0.56*  CALCIUM 8.2* 7.9*  --  7.9*  --  7.9*  --   --  7.6*  MG  --  1.6*  --  1.8  --  1.6*  --   --  2.0  PHOS  --  3.5   < > 2.7 2.3* 2.4* 1.2* 1.4*  --    < > = values in this interval not displayed.    GFR: Estimated Creatinine Clearance: 60.7 mL/min (A) (by C-G formula based on SCr of 0.56 mg/dL (L)).  Liver Function Tests: Recent Labs  Lab 02/19/21 2200 02/09/2021 2330 02/22/21 0332 02/23/21 0319 02/24/21 0521  AST '19 26 29 31 26  '$ ALT '12 17 21 23 23  '$ ALKPHOS 56 49 54 52 45  BILITOT 0.6 0.5 0.7 0.8 1.0  PROT 6.2* 5.9* 6.2* 6.1* 5.6*  ALBUMIN 3.3* 3.2* 3.3* 3.1* 2.8*    CBG: Recent Labs  Lab 02/24/21 2354 02/25/21 0729 02/25/21 1201 02/25/21 1610 02/25/21 2100  GLUCAP 223* 314* 288* 217* 199*      Recent Results (from the past 240 hour(s))  Urine Culture     Status: Abnormal   Collection Time: 02/19/21  9:45 PM   Specimen: In/Out Cath Urine  Result Value Ref Range Status   Specimen Description   Final    IN/OUT CATH URINE Performed at [facility], [place] [address] [place], [place] [address]    Special Requests   Final    NONE Performed at [facility], [place] [address] [place], [place] [address]    Culture MULTIPLE SPECIES PRESENT, SUGGEST RECOLLECTION (A)  Final   Report Status 02/21/2021 FINAL  Final  Resp Panel by RT-PCR (Flu A&B, Covid) Nasopharyngeal Swab     Status: Abnormal   Collection Time: 02/19/21  9:46 PM   Specimen: Nasopharyngeal Swab; Nasopharyngeal(NP) swabs in vial transport medium  Result Value Ref Range Status   SARS Coronavirus 2 by RT PCR POSITIVE (A) NEGATIVE Final    Comment: RESULT CALLED TO, READ BACK BY AND VERIFIED WITH: Marina Gravel @ [phone] ON 02/23/2021 C VARNER (NOTE) SARS-CoV-2 target nucleic acids are DETECTED.  The SARS-CoV-2 RNA is generally detectable in upper respiratory specimens during the acute phase of infection. Positive results are indicative of the presence of the identified virus, but do not rule out bacterial infection or co-infection with other pathogens not detected by the test. Clinical correlation with patient history and other diagnostic information is necessary to determine patient infection status. The expected result is Negative.  Fact Sheet for Patients: EntrepreneurPulse.com.au  Fact Sheet for Healthcare Providers: IncredibleEmployment.be  This test is not yet  approved or cleared by the [place] and  has been authorized for detection and/or diagnosis of SARS-CoV-2 by FDA under an Emergency Use Authorization (EUA).  This EUA will remain in effect (meaning this te st can be used) for the duration of  the COVID-19 declaration  under Section 564(b)(1) of the Act, 21 U.S.C. section 360bbb-3(b)(1), unless the authorization is terminated or revoked sooner.     Influenza A by PCR NEGATIVE NEGATIVE Final   Influenza B by PCR NEGATIVE NEGATIVE Final    Comment: (NOTE) The Xpert Xpress SARS-CoV-2/FLU/RSV plus assay is intended as an aid in the diagnosis of influenza from Nasopharyngeal swab specimens and should not be used as a sole basis for treatment. Nasal washings and aspirates are unacceptable for Xpert Xpress SARS-CoV-2/FLU/RSV testing.  Fact Sheet for Patients: EntrepreneurPulse.com.au  Fact Sheet for Healthcare Providers: IncredibleEmployment.be  This test is not yet approved or cleared by the [place] FDA and has been authorized for detection and/or diagnosis of SARS-CoV-2 by FDA under an Emergency Use Authorization (EUA). This EUA will remain in effect (meaning this test can be used) for the duration of the COVID-19 declaration under Section 564(b)(1) of the Act, 21 U.S.C. section 360bbb-3(b)(1), unless the authorization is terminated or revoked.  Performed at [facility], [place] [address], [place], [place] [address]   Blood Culture (routine x 2)     Status: None   Collection Time: 02/19/21  9:55 PM   Specimen: BLOOD  Result Value Ref Range Status   Specimen Description   Final    BLOOD BLOOD RIGHT FOREARM Performed at [place] [address] [place], [place] [address]    Special Requests   Final    BOTTLES DRAWN AEROBIC AND ANAEROBIC Blood Culture adequate volume Performed at [place] [address], [place], [place] [address]    Culture   Final    NO GROWTH 5 DAYS Performed at [facility], [place] [address] [place], [place] [address]    Report Status 02/25/2021 FINAL  Final  Blood Culture (routine x 2)     Status: None   Collection Time: 02/19/21 10:10 PM   Specimen: BLOOD   Result Value Ref Range Status   Specimen Description   Final    BLOOD RIGHT ANTECUBITAL Performed at [place] [address] [place], [place] [address]    Special Requests   Final    BOTTLES DRAWN AEROBIC AND ANAEROBIC Blood Culture adequate volume Performed at [place] [address] [place], [place] [address]    Culture   Final    NO GROWTH 5 DAYS Performed at [facility], [place] [address], [place], [place] [address]    Report Status 02/25/2021 FINAL  Final         Radiology Studies: No results found.      Scheduled Meds:  chlorhexidine  15 mL Mouth Rinse BID   enoxaparin (LOVENOX) injection  40 mg Subcutaneous Q24H   feeding supplement (PROSource TF)  45 mL Per Tube BID   free water  100 mL Per Tube Q4H   insulin aspart  0-6 Units Subcutaneous Q4H   insulin glargine-yfgn  10 Units Subcutaneous QHS   mouth rinse  15 mL Mouth Rinse q12n4p   Continuous Infusions:  sodium chloride 50 mL/hr at 02/25/21 0350   feeding supplement (OSMOLITE 1.2 CAL) 1,000 mL (02/25/21 0352)     LOS: 4 days  Time spent: 35 minutes    Irine Seal, MD [facility]   To contact the attending provider between 7A-7P or the covering provider during after hours 7P-7A, please log into the web site [email or web address] and access using universal [place] password for that web site. If you do not have the password, please call the hospital operator.  02/25/2021, 10:40 PM

## 2021-02-25 NOTE — Progress Notes (Signed)
Daily Progress Note   Patient Name: Stanley Gilbert       Date: 02/25/2021 DOB: 15-Apr-1931  Age: 87 y.o. MRN#: [id number] Attending Physician: Eugenie Filler, MD Primary Care Physician: [name], MD Admit Date: 02/18/2021  Reason for Consultation/Follow-up: Establishing goals of care  Subjective: Patient is resting in bed. Overall, appears the same, trial of NGT and tube feeds is ongoing, still noted to be wincing/grimacing at times, wife not at bedside today.     Length of Stay: 4  Current Medications: Scheduled Meds:   chlorhexidine  15 mL Mouth Rinse BID   enoxaparin (LOVENOX) injection  40 mg Subcutaneous Q24H   feeding supplement (PROSource TF)  45 mL Per Tube BID   free water  100 mL Per Tube Q4H   insulin aspart  0-6 Units Subcutaneous Q4H   insulin glargine-yfgn  10 Units Subcutaneous QHS   mouth rinse  15 mL Mouth Rinse q12n4p    Continuous Infusions:  sodium chloride 50 mL/hr at 02/25/21 0350   feeding supplement (OSMOLITE 1.2 CAL) 1,000 mL (02/25/21 0352)   sodium phosphate  Dextrose 5% IVPB      PRN Meds: acetaminophen, docusate **AND** sennosides, guaiFENesin-dextromethorphan, hydrALAZINE, lip balm, ondansetron **OR** ondansetron (ZOFRAN) IV  Physical Exam         Appears frail chronically ill Regular work of breathing Has NG tube in No edema Does not follow commands  Vital Signs: BP 134/63 (BP Location: Right Arm)   Pulse 91   Temp 99.2 F (37.3 C) (Axillary)   Resp 18   Ht '5\' 11"'$  (1.803 m)   Wt 67.2 kg   SpO2 93%   BMI 20.66 kg/m  SpO2: SpO2: 93 % O2 Device: O2 Device: Room Air O2 Flow Rate:    Intake/output summary:  Intake/Output Summary (Last 24 hours) at 02/25/2021 1406 Last data filed at 02/24/2021 1945 Gross per 24 hour  Intake  --  Output 450 ml  Net -450 ml    LBM: Last BM Date: 02/23/21 Baseline Weight: Weight: 63.6 kg Most recent weight: Weight: 67.2 kg Palliative performance scale 30%.      Palliative Assessment/Data:    Flowsheet Rows    Flowsheet Row Most Recent Value  Intake Tab   Referral Department Hospitalist  Unit at Time of Referral Med/Surg Unit  Palliative Care Primary Diagnosis Sepsis/Infectious Disease  Date Notified 02/22/21  Reason for referral Clarify Goals of Care  Date of Admission 02/25/2021  Date first seen by Palliative Care 02/23/21  # of days Palliative referral response time 1 Day(s)  # of days IP prior to Palliative referral 2  Clinical Assessment   Palliative Performance Scale Score 10%  Psychosocial & Spiritual Assessment   Palliative Care Outcomes   Patient/Family meeting held? Yes  Who was at the meeting? Wife       Patient Active Problem List   Diagnosis Date Noted   Feeding difficulties    Weakness    Palliative care by specialist    Goals of care, counseling/discussion    Pressure injury of skin 02/23/2021   Acute COVID-19 02/21/2021   Dementia ([place])    Acute encephalopathy    Type 2 diabetes mellitus with hypoglycemia without coma ([place]) 07/27/2013   Psoriasis and similar disorder 10/23/2012    Palliative Care Assessment & Plan   Patient Profile:    Assessment: 87 year old gentleman who lives at home with his wife with a history of dementia admitted with generalized weakness anorexia increasing confusion and fever.  Patient admitted to hospital medicine service for COVID-pneumonia, electrolyte abnormalities and hospital course complicated by ongoing dysphagia.  Patient now on tube feeds via NG tube.  Palliative services following for goals of care discussions.  Recommendations/Plan: Continue current mode of care, as per PMT discussions with wife, she remains hopeful for stabilization/recovery and remains invested in continuing current therapies. No  role for PMT at this time, but will follow hospital course peripherally and will remain available to re discuss goals of care if agreeable to wife.     Goals of Care and Additional Recommendations: Limitations on Scope of Treatment: Full Scope Treatment  Code Status:    Code Status Orders  (From admission, onward)           Start     Ordered   02/21/21 0142  Do not attempt resuscitation (DNR)  Continuous       Question Answer Comment  In the event of cardiac or respiratory ARREST Do not call a "code blue"   In the event of cardiac or respiratory ARREST Do not perform Intubation, CPR, defibrillation or ACLS   In the event of cardiac or respiratory ARREST Use medication by any route, position, wound care, and other measures to relive pain and suffering. May use oxygen, suction and manual treatment of airway obstruction as needed for comfort.      02/21/21 0143           Code Status History     This patient has a current code status but no historical code status.       Prognosis:  Unable to determine  Discharge Planning: To Be Determined  Care plan was discussed with  IDT  Thank you for allowing the Palliative Medicine Team to assist in the care of this patient.   Time In: 12 Time Out: 12.15 Total Time 15 Prolonged Time Billed  no       Greater than 50%  of this time was spent counseling and coordinating care related to the above assessment and plan.  [name], MD  Please contact Palliative Medicine Team phone at [phone] for questions and concerns.

## 2021-02-25 NOTE — Progress Notes (Signed)
Went to pt's room, NGT was out and pt was holding the tube in his hand. MD aware. Will ask ST to re-evaluate pt.

## 2021-02-26 ENCOUNTER — Inpatient Hospital Stay (HOSPITAL_COMMUNITY): Payer: Medicare Other

## 2021-02-26 DIAGNOSIS — Z7189 Other specified counseling: Secondary | ICD-10-CM | POA: Diagnosis not present

## 2021-02-26 DIAGNOSIS — R633 Feeding difficulties, unspecified: Secondary | ICD-10-CM | POA: Diagnosis not present

## 2021-02-26 DIAGNOSIS — Z515 Encounter for palliative care: Secondary | ICD-10-CM | POA: Diagnosis not present

## 2021-02-26 DIAGNOSIS — G9341 Metabolic encephalopathy: Secondary | ICD-10-CM | POA: Diagnosis present

## 2021-02-26 DIAGNOSIS — U071 COVID-19: Secondary | ICD-10-CM | POA: Diagnosis not present

## 2021-02-26 DIAGNOSIS — G9340 Encephalopathy, unspecified: Secondary | ICD-10-CM | POA: Diagnosis not present

## 2021-02-26 DIAGNOSIS — F0390 Unspecified dementia without behavioral disturbance: Secondary | ICD-10-CM | POA: Diagnosis not present

## 2021-02-26 DIAGNOSIS — R1310 Dysphagia, unspecified: Secondary | ICD-10-CM | POA: Diagnosis not present

## 2021-02-26 DIAGNOSIS — R509 Fever, unspecified: Secondary | ICD-10-CM

## 2021-02-26 LAB — CBC
HCT: 35.1 % — ABNORMAL LOW (ref 39.0–52.0)
Hemoglobin: 11.7 g/dL — ABNORMAL LOW (ref 13.0–17.0)
MCH: 31 pg (ref 26.0–34.0)
MCHC: 33.3 g/dL (ref 30.0–36.0)
MCV: 93.1 fL (ref 80.0–100.0)
Platelets: 167 10*3/uL (ref 150–400)
RBC: 3.77 MIL/uL — ABNORMAL LOW (ref 4.22–5.81)
RDW: 13.2 % (ref 11.5–15.5)
WBC: 7 10*3/uL (ref 4.0–10.5)
nRBC: 0 % (ref 0.0–0.2)

## 2021-02-26 LAB — GLUCOSE, CAPILLARY
Glucose-Capillary: 167 mg/dL — ABNORMAL HIGH (ref 70–99)
Glucose-Capillary: 134 mg/dL — ABNORMAL HIGH (ref 70–99)
Glucose-Capillary: 132 mg/dL — ABNORMAL HIGH (ref 70–99)
Glucose-Capillary: 218 mg/dL — ABNORMAL HIGH (ref 70–99)
Glucose-Capillary: 140 mg/dL — ABNORMAL HIGH (ref 70–99)

## 2021-02-26 LAB — URINALYSIS, ROUTINE W REFLEX MICROSCOPIC
Bacteria, UA: NONE SEEN
Bilirubin Urine: NEGATIVE
Glucose, UA: 50 mg/dL — AB
Ketones, ur: 20 mg/dL — AB
Leukocytes,Ua: NEGATIVE
Nitrite: NEGATIVE
Protein, ur: 30 mg/dL — AB
RBC / HPF: >50 RBC/hpf — ABNORMAL HIGH (ref 0–5)
Specific Gravity, Urine: 1.023 (ref 1.005–1.030)
pH: 5 (ref 5.0–8.0)

## 2021-02-26 LAB — RENAL FUNCTION PANEL
Albumin: 2.5 g/dL — ABNORMAL LOW (ref 3.5–5.0)
Anion gap: 8 (ref 5–15)
BUN: 15 mg/dL (ref 8–23)
CO2: 26 mmol/L (ref 22–32)
Calcium: 7.7 mg/dL — ABNORMAL LOW (ref 8.9–10.3)
Chloride: 102 mmol/L (ref 98–111)
Creatinine, Ser: 0.56 mg/dL — ABNORMAL LOW (ref 0.61–1.24)
GFR, Estimated: >60 mL/min (ref >60)
Glucose, Bld: 160 mg/dL — ABNORMAL HIGH (ref 70–99)
Phosphorus: 2.1 mg/dL — ABNORMAL LOW (ref 2.5–4.6)
Potassium: 3.9 mmol/L (ref 3.5–5.1)
Sodium: 136 mmol/L (ref 135–145)

## 2021-02-26 LAB — MAGNESIUM: Magnesium: 1.8 mg/dL (ref 1.7–2.4)

## 2021-02-26 LAB — MRSA NEXT GEN BY PCR, NASAL: MRSA by PCR Next Gen: NOT DETECTED

## 2021-02-26 MED ORDER — VANCOMYCIN HCL 1250 MG/250ML IV SOLN
1250.0000 mg | INTRAVENOUS | Status: DC
  Administered 2021-02-27 – 2021-03-01 (×3): 1250 mg via INTRAVENOUS
  Filled 2021-02-26 (×3): qty 250

## 2021-02-26 MED ORDER — INSULIN ASPART 100 UNIT/ML IJ SOLN
0.0000 [IU] | Freq: Four times a day (QID) | INTRAMUSCULAR | Status: DC
  Administered 2021-02-26: 2 [IU] via SUBCUTANEOUS
  Administered 2021-02-27: 1 [IU] via SUBCUTANEOUS
  Administered 2021-02-28: 2 [IU] via SUBCUTANEOUS
  Administered 2021-02-28: 3 [IU] via SUBCUTANEOUS
  Administered 2021-02-28: 2 [IU] via SUBCUTANEOUS
  Administered 2021-02-28 – 2021-03-01 (×2): 3 [IU] via SUBCUTANEOUS
  Administered 2021-03-01 (×2): 4 [IU] via SUBCUTANEOUS
  Administered 2021-03-01: 3 [IU] via SUBCUTANEOUS
  Administered 2021-03-01: 5 [IU] via SUBCUTANEOUS
  Administered 2021-03-02: 3 [IU] via SUBCUTANEOUS

## 2021-02-26 MED ORDER — MAGNESIUM SULFATE 2 GM/50ML IV SOLN
2.0000 g | Freq: Once | INTRAVENOUS | Status: AC
  Administered 2021-02-26: 2 g via INTRAVENOUS
  Filled 2021-02-26: qty 50

## 2021-02-26 MED ORDER — SODIUM CHLORIDE 0.9 % IV SOLN
2.0000 g | Freq: Two times a day (BID) | INTRAVENOUS | Status: DC
  Administered 2021-02-26 – 2021-02-28 (×5): 2 g via INTRAVENOUS
  Filled 2021-02-26 (×5): qty 2

## 2021-02-26 MED ORDER — ACETAMINOPHEN 650 MG RE SUPP
650.0000 mg | RECTAL | Status: DC | PRN
  Administered 2021-02-26 – 2021-02-27 (×2): 650 mg via RECTAL
  Filled 2021-02-26 (×2): qty 1

## 2021-02-26 MED ORDER — VANCOMYCIN HCL 1500 MG/300ML IV SOLN
1500.0000 mg | Freq: Once | INTRAVENOUS | Status: AC
  Administered 2021-02-26: 1500 mg via INTRAVENOUS
  Filled 2021-02-26: qty 300

## 2021-02-26 MED ORDER — GLYCOPYRROLATE 0.2 MG/ML IJ SOLN
0.1000 mg | Freq: Four times a day (QID) | INTRAMUSCULAR | Status: AC
Start: 2021-02-26 — End: 2021-02-28
  Administered 2021-02-26 – 2021-02-28 (×8): 0.1 mg via INTRAVENOUS
  Filled 2021-02-26 (×8): qty 1

## 2021-02-26 MED ORDER — POTASSIUM PHOSPHATES 15 MMOLE/5ML IV SOLN
30.0000 mmol | Freq: Once | INTRAVENOUS | Status: AC
  Administered 2021-02-26: 30 mmol via INTRAVENOUS
  Filled 2021-02-26: qty 10

## 2021-02-26 NOTE — Progress Notes (Addendum)
Daily Progress Note   Patient Name: Stanley Gilbert       Date: 02/26/2021 DOB: January 24, 1931  Age: 87 y.o. MRN#: [id number] Attending Physician: Eugenie Filler, MD Primary Care Physician: [name], MD Admit Date: 03/07/2021  Reason for Consultation/Follow-up: Establishing goals of care  Subjective: Patient is resting in bed.  He is unresponsive, mouth open, has some gurgling respirations, has mildly worsening generalized edema.  Wife present at bedside and she immediately stated that her 2 acute concerns are for the NG tube to be replaced and artificial nutrition to be resumed as well as for the patient to undergo either CT or MRI for evaluation of her stroke.  Gently attempted goals of care discussions, see below.    Length of Stay: 5  Current Medications: Scheduled Meds:  . chlorhexidine  15 mL Mouth Rinse BID  . enoxaparin (LOVENOX) injection  40 mg Subcutaneous Q24H  . feeding supplement (PROSource TF)  45 mL Per Tube BID  . free water  100 mL Per Tube Q4H  . glycopyrrolate  0.1 mg Intravenous Q6H  . insulin aspart  0-6 Units Subcutaneous Q4H  . insulin glargine-yfgn  10 Units Subcutaneous QHS  . mouth rinse  15 mL Mouth Rinse q12n4p    Continuous Infusions: . sodium chloride 100 mL/hr at 02/26/21 1103  . feeding supplement (OSMOLITE 1.2 CAL) 1,000 mL (02/25/21 0352)  . potassium PHOSPHATE IVPB (in mmol) 30 mmol (02/26/21 1104)    PRN Meds: acetaminophen, docusate **AND** sennosides, guaiFENesin-dextromethorphan, hydrALAZINE, lip balm, ondansetron **OR** ondansetron (ZOFRAN) IV  Physical Exam         Appears frail chronically ill Shallow breath sounds Patient pulled out his NG tube Mouth open, unresponsive, having mildly gurgling respirations Does not verbalize,  does not follow commands 1-2 + both lower extremities edema   Vital Signs: BP (!) 145/64 (BP Location: Right Arm)   Pulse 93   Temp (!) 101.5 F (38.6 C) (Oral)   Resp (!) 24   Ht '5\' 11"'$  (1.803 m)   Wt 65.4 kg   SpO2 91%   BMI 20.11 kg/m  SpO2: SpO2: 91 % O2 Device: O2 Device: Room Air O2 Flow Rate:    Intake/output summary:  Intake/Output Summary (Last 24 hours) at 02/26/2021 1119 Last data filed at 02/26/2021 0435 Gross per  24 hour  Intake --  Output 1800 ml  Net -1800 ml    LBM: Last BM Date: 02/23/21 Baseline Weight: Weight: 63.6 kg Most recent weight: Weight: 65.4 kg Palliative performance scale 30%.      Palliative Assessment/Data:    Flowsheet Rows    Flowsheet Row Most Recent Value  Intake Tab   Referral Department Hospitalist  Unit at Time of Referral Med/Surg Unit  Palliative Care Primary Diagnosis Sepsis/Infectious Disease  Date Notified 02/22/21  Reason for referral Clarify Goals of Care  Date of Admission 03/08/2021  Date first seen by Palliative Care 02/23/21  # of days Palliative referral response time 1 Day(s)  # of days IP prior to Palliative referral 2  Clinical Assessment   Palliative Performance Scale Score 10%  Psychosocial & Spiritual Assessment   Palliative Care Outcomes   Patient/Family meeting held? Yes  Who was at the meeting? Wife       Patient Active Problem List   Diagnosis Date Noted  . Hypomagnesemia   . Hypophosphatemia   . Hypokalemia   . Dysphagia   . Feeding difficulties   . Weakness   . Palliative care by specialist   . Goals of care, counseling/discussion   . Pressure injury of skin 02/23/2021  . COVID-19 02/21/2021  . Dementia ([place])   . Acute encephalopathy   . Type 2 diabetes mellitus with hypoglycemia without coma ([place]) 07/27/2013  . Psoriasis and similar disorder 10/23/2012    Palliative Care Assessment & Plan   Patient Profile:    Assessment: 87 year old gentleman who lives at home with his wife  with a history of dementia admitted with generalized weakness anorexia increasing confusion and fever.  Patient admitted to hospital medicine service for COVID-pneumonia, electrolyte abnormalities and hospital course complicated by ongoing dysphagia.  Patient now on tube feeds via NG tube.  Palliative services following for goals of care discussions.  Recommendations/Plan: Chart reviewed, discussed with wife at bedside.  She continues to recall that the patient was verbalizing and ambulating normally prior to this current hospitalization.  She thought he was being admitted for treatment of urinary infection.  Attempted gentle goals of care discussions about the patient's acute and sudden rapid decline trajectory and shared with her concerns of this being irreversible.  Discussed about shifting efforts to more of a comfort focused/symptom management focused pathway.  Patient's wife becomes irritable, she states that she wants continuation of artificial nutrition as well as brain imaging to rule out stroke. She also demands that Dr Harrington Challenger, patient's PCP come and assume care for the patient. At this point I exited the room and discussed with [facility] MD Dr. Grandville Silos who will evaluate the patient.  Continue multidisciplinary and interdisciplinary support for both patient and wife at this time.   Prognosis remains guarded and the patient appears to have a markedly limited life expectancy a few days in my opinion.  We will continue efforts at establishing rapport and trust relationship with patient's wife and continue efforts for meeting her goals for the patient.   Goals of Care and Additional Recommendations: Limitations on Scope of Treatment: Full Scope Treatment  Code Status:    Code Status Orders  (From admission, onward)           Start     Ordered   02/21/21 0142  Do not attempt resuscitation (DNR)  Continuous       Question Answer Comment  In the event of cardiac or respiratory ARREST Do not call  a  "code blue"   In the event of cardiac or respiratory ARREST Do not perform Intubation, CPR, defibrillation or ACLS   In the event of cardiac or respiratory ARREST Use medication by any route, position, wound care, and other measures to relive pain and suffering. May use oxygen, suction and manual treatment of airway obstruction as needed for comfort.      02/21/21 0143           Code Status History     This patient has a current code status but no historical code status.       Prognosis: Guarded  Discharge Planning:  Could be anticipated hospital death. Care plan was discussed with nursing leadership on the fourth floor, Dr. Grandville Silos, patient's wife in the room.  Thank you for allowing the Palliative Medicine Team to assist in the care of this patient.   Time In: 10 Time Out: 10.40 Total Time 40 Prolonged Time Billed  no       Greater than 50%  of this time was spent counseling and coordinating care related to the above assessment and plan.  [name], MD  Please contact Palliative Medicine Team phone at [phone] for questions and concerns.

## 2021-02-26 NOTE — Progress Notes (Signed)
  Speech Language Pathology Treatment: Dysphagia  Patient Details Name: Stanley Gilbert MRN: [id number] DOB: 06/07/1931 Today's Date: 02/26/2021 Time: 4696-2952 SLP Time Calculation (min) (ACUTE ONLY): 45 min  Assessment / Plan / Recommendation Clinical Impression  Pt seen to assess for readiness for po diet.  He is lethargic with hyperextension of his neck suspected due to dementia.  He did not open his eyes or awaken during entire approx 40 minute session.    Significant dried white secretions - on lingual surface and right soft palate which SLP was able to clear with extensive oral care using thin, nectar water, ice chips - oral suction kit. It is notable that pt has congested cough even oral care - concerning for potential secretion aspiration - ? retained secretions in pharynx.     He is intermittently forming labial closure on suction while pt removing secretions but did not elicit swallow.  PO trials commenced including ice, thin water, thickened water, thickened applejuice and orange sherbert.  18/20 boluses were suctioned by SLP due to pt not elicting swallow - despite dry spoon stimulation and total verba/visual cues.   With final 2/20 boluses - orange sherbert - pt masticated" icecream and elicited delayed multiple swallows - impaired laryngeal elevation suspected with gross pharyngeal weakness and aspiration.  Pt with congested coughing intermittently over approximately 5 minutes. SLP provided aggressive oral care including oral suctioning.   Attempted one more small ice chip bolus- without pt manipulation.   This pt's mentation and suspected gross deconditioning, dysphagia are barries to any safe po intake.    Recommend RN ONLY attempt small tsps of water and single ice chips - orally suctioning if he does not swallow - otherwise NPO. Discussed with MD and RN - and will follow up next date to reasses per MD - with wife present.    HPI HPI: Patient is an 87 y.o. male with PMH: dementia  and type 2 diabetes mellitus who presented to ER from home for evaluation of fatigue, general weakness, anorexia, increased confusion, and fevers. In ED, head CT negative for acute findings, urinalysis not suggestive of infection and positive COVID-19 PCR. Per wife report in ED, she has been having difficulty getting patient to eat anything for past two days. CXR revealed very mild right basilar atelectasis or early infiltrate.      SLP Plan  Continue with current plan of care       Recommendations  Diet recommendations: NPO Medication Administration: Via alternative means                Oral Care Recommendations: Oral care QID;Staff/trained caregiver to provide oral care Follow up Recommendations: None SLP Visit Diagnosis: Dysphagia, unspecified (R13.10) Plan: Continue with current plan of care       GO              Kathleen Lime, MS [place] Office [phone] Pager [phone]   Macario Golds 02/26/2021, 12:11 PM

## 2021-02-26 NOTE — Progress Notes (Signed)
PROGRESS NOTE    Stanley Gilbert  [id number] DOB: [birth year] DOA: 03/02/2021 PCP: [name], MD    Chief Complaint  Patient presents with   Fever   Fatigue   Covid Positive    Brief Narrative:   87 year old male with history of dementia type 2 diabetes admitted with generalized weakness anorexia increased confusion and fever.  Patient lives with his wife for 59 years at home.  Per his wife prior to admission he was able to ambulate in the house, feed himself and use the bathroom on his own.   He was found to be COVID-positive.  Head CT shows no acute findings.  Patient has been having decreased p.o. intake for 2 days prior to admission.  She is trying to get home health at home so he can she can take him home.  She wants him to eat again.  He was seen by speech therapy and he is a high aspiration risk and has recommended NPO.  Wife requesting to have PEG tube placed for nutrition so she can take him home.  I have consulted palliative care.  Wife reports that she has promised her husband that she will never put him in the nursing home and that she will take care of him at home till he passes away. She is 87 years old and she is tired and she is trying to get help at home to help her with taking care of him.  She has a daughter who lives in the state.     Assessment & Plan:   Principal Problem:   COVID-19 Active Problems:   Fever   Acute metabolic encephalopathy   Type 2 diabetes mellitus with hypoglycemia without coma (HCC)   Dementia (HCC)   Acute encephalopathy   Pressure injury of skin   Feeding difficulties   Weakness   Palliative care by specialist   Goals of care, counseling/discussion   Hypomagnesemia   Hypophosphatemia   Hypokalemia   Dysphagia  #1 COVID-pneumonia -Status post full course remdesivir. -Chest x-ray done did show some left basilar infiltrates patient was not on antibiotics as likely secondary to COVID-pneumonia. -Supportive care.  2.   Fever -Patient noted to be spiking fevers with a temp of 101.5 this morning. -Patient minimally responsive, with ongoing acute encephalopathy despite treatment of COVID-pneumonia. -Check blood cultures x2, repeat UA with cultures and sensitivities, repeat chest x-ray with new bilateral pleural effusions, decreased aeration of both lung bases may represent pneumonia and/or atelectasis, check urine Legionella antigen, check urine pneumococcus antigen. -Patient in current state unlikely able to tolerate LP. -We will place empirically on IV vancomycin and IV cefepime. -Supportive care.  3.  Acute metabolic encephalopathy -Initially felt likely secondary to COVID-pneumonia in the setting of dementia. -Patient not improving clinically. -CT head done on admission negative. -Family concerned for acute CVA and requesting MRI brain be done. -Check MRI brain. -Patient also noted to have fevers and as such we will panculture. -Patient in current state likely unable to tolerate LP. -Place empirically on IV vancomycin IV cefepime.  4.  Hypokalemia/hypomagnesemia/hyponatremia -Likely secondary to poor oral intake. -Potassium at 3.9, magnesium at 1.8, phosphorus at 2.1.  -K-Phos 30 mmol IV x1.  Magnesium sulfate 2 g IV x1.   -Repeat labs in the morning.    5.  Diabetes mellitus type 2 -Patient with poor oral intake. -Patient started on tube feeds at family's insistence and patient pulled out NG tube with tube and hand. -Hemoglobin A1c 7.3 (  02/21/2021) -CBG 134 this morning.   -Continue to hold glimepiride.   -Continue Lantus, SSI.   -Change CBGs to every 6 hours.  6.  History of dementia -Patient with history of dementia, has been declining, lives at home with his wife who is 16 years old and they have been married for 8 years. -Per Dr. Zigmund  in discussion with wife patient at baseline able to ambulate in the house, feed himself, use the restroom until he was recently admitted during this  hospitalization. -Patient with poor oral intake, NG tube placed however patient pulled out NG tube. -Palliative care consulted however seems as if family is resistant to hospice at this time or palliation and wanting to take patient home with home health. -Palliative care following. -Follow.  7.  Goals of care -Patient is DNR, patient with dementia with poor oral intake recently.  Small bore NG tube placed 02/23/2021 for nutrition which was supposed to be a temporary trial while patient was n.p.o. as he is high aspiration risk. -Patient pulled out NG tube. -Monitor over the next 24 hours. -If no significant improvement will need to discuss with wife regarding reassessment of hospice versus palliation. -It is noted per Dr. Zigmund  that wife does not want to hear palliative or hospice at this time and very adamant that patient will not go to a nursing home but will go home when she can take care of him in his final days. -Patient has been declining, spoke with wife again who feels further work-up needed in terms of her MRI for further evaluation and at this time not ready to transition to full comfort measures. -Wife however is open to home with hospice if patient was to continue decline with no significant improvement. -Wife is open to palliative care following while in-house.  8.  High risk aspiration/dysphagia -NG tube was placed on 02/23/2021 for feeding however patient pulled out NG tube 02/25/2021.   -SLP to reassess.   -Wife wants NG tube to be placed back in however concerned that patient may dislodge it again.  9.  Pressure injury, POA Pressure Injury 02/21/21 Buttocks Left Stage 2 -  Partial thickness loss of dermis presenting as a shallow open injury with a red, pink wound bed without slough. (Active)  02/21/21 1700  Location: Buttocks  Location Orientation: Left  Staging: Stage 2 -  Partial thickness loss of dermis presenting as a shallow open injury with a red, pink wound bed  without slough.  Wound Description (Comments):   Present on Admission: Yes         DVT prophylaxis: Lovenox Code Status: DNR Family Communication: Updated wife at bedside.  Disposition:   Status is: Inpatient  Remains inpatient appropriate because:Persistent severe electrolyte disturbances  Dispo: The patient is from: Home              Anticipated d/c is to: Home              Patient currently is not medically stable to d/c.   Difficult to place patient No       Consultants:  Palliative care: Dr. Domingo Cocking 02/23/2021  Procedures:  CT head 02/19/2021 Chest x-ray 02/19/2021, 03/10/2021, 02/26/2021 Abdominal films 02/23/2021 MRI head pending 02/26/2021  Antimicrobials:  IV remdesivir 02/21/2021>>>>> 02/25/2021 IV vancomycin 02/26/2021>>>>> IV cefepime 02/26/2021>>>>>   Subjective: Patient moans with noxious stimuli, resting comfortably otherwise.  Objective: Vitals:   02/26/21 0431 02/26/21 0435 02/26/21 1111 02/26/21 1421  BP: (!) 153/61  (!) 145/64   Pulse:  92  93   Resp: 20  (!) 24 20  Temp: 98.7 F (37.1 C)  (!) 101.5 F (38.6 C) 99.5 F (37.5 C)  TempSrc: Oral  Oral Axillary  SpO2: 95%  91%   Weight:  65.4 kg    Height:        Intake/Output Summary (Last 24 hours) at 02/26/2021 1556 Last data filed at 02/26/2021 1200 Gross per 24 hour  Intake --  Output 1300 ml  Net -1300 ml   Filed Weights   02/23/21 0611 02/25/21 0355 02/26/21 0435  Weight: 63.6 kg 67.2 kg 65.4 kg    Examination:  General exam: Dry mucous membranes.  Frail. Respiratory system: CTA B anterior lung fields.  No wheezes, no crackles, no rhonchi.  Normal respiratory effort.  Some gurgling noted in the back of the throat.  Cardiovascular system: Regular rate and rhythm no murmurs rubs or gallops.  No JVD.  No lower extremity edema.  Gastrointestinal system: Abdomen is soft, nondistended, nontender, positive bowel sounds.  No rebound.  No guarding.  Central nervous system: Asleep.  Moving  extremities spontaneously.  No focal neurological deficits.  Extremities: Symmetric 5 x 5 power. Skin: No rashes, lesions or ulcers Psychiatry: Judgement and insight appear poor. Mood & affect unable to assess.    Data Reviewed: I have personally reviewed following labs and imaging studies  CBC: Recent Labs  Lab 02/19/21 2200 03/03/2021 2330 02/22/21 0332 02/23/21 0319 02/24/21 0521 02/26/21 0414  WBC 6.1 5.9 5.0 6.4 8.9 7.0  NEUTROABS 4.2 3.7 3.6 5.0 7.2  --   HGB 11.1* 11.7* 12.4* 12.8* 12.4* 11.7*  HCT 34.0* 36.1* 36.8* 39.0 36.8* 35.1*  MCV 98.0 96.8 93.9 94.4 93.4 93.1  PLT 170 162 168 164 156 [id number]    Basic Metabolic Panel: Recent Labs  Lab 02/22/21 0332 02/23/21 0319 02/23/21 1626 02/24/21 0521 02/24/21 1628 02/25/21 0337 02/25/21 0340 02/26/21 0414  NA 134* 134*  --  133*  --   --  132* 136  K 3.2* 4.1  --  3.2*  --   --  3.3* 3.9  CL 98 101  --  100  --   --  100 102  CO2 25 26  --  23  --   --  25 26  GLUCOSE 159* 118*  --  215*  --   --  277* 160*  BUN 13 11  --  16  --   --  15 15  CREATININE 0.59* 0.59*  --  0.60*  --   --  0.56* 0.56*  CALCIUM 7.9* 7.9*  --  7.9*  --   --  7.6* 7.7*  MG 1.6* 1.8  --  1.6*  --   --  2.0 1.8  PHOS 3.5 2.7 2.3* 2.4* 1.2* 1.4*  --  2.1*    GFR: Estimated Creatinine Clearance: 59 mL/min (A) (by C-G formula based on SCr of 0.56 mg/dL (L)).  Liver Function Tests: Recent Labs  Lab 02/19/21 2200 02/18/2021 2330 02/22/21 0332 02/23/21 0319 02/24/21 0521 02/26/21 0414  AST '19 26 29 31 26  '$ --   ALT '12 17 21 23 23  '$ --   ALKPHOS 56 49 54 52 45  --   BILITOT 0.6 0.5 0.7 0.8 1.0  --   PROT 6.2* 5.9* 6.2* 6.1* 5.6*  --   ALBUMIN 3.3* 3.2* 3.3* 3.1* 2.8* 2.5*    CBG: Recent Labs  Lab 02/25/21 2100 02/25/21 2356 02/26/21 0428 02/26/21 0734 02/26/21  [place]     Recent Results (from the past 240 hour(s))  Urine Culture     Status: Abnormal   Collection Time: 02/19/21  9:45 PM    Specimen: In/Out Cath Urine  Result Value Ref Range Status   Specimen Description   Final    IN/OUT CATH URINE Performed at [facility], [place] [address], [place], [place] [address]    Special Requests   Final    NONE Performed at [facility], [place] [address] [place], [place] [address]    Culture MULTIPLE SPECIES PRESENT, SUGGEST RECOLLECTION (A)  Final   Report Status 02/21/2021 FINAL  Final  Resp Panel by RT-PCR (Flu A&B, Covid) Nasopharyngeal Swab     Status: Abnormal   Collection Time: 02/19/21  9:46 PM   Specimen: Nasopharyngeal Swab; Nasopharyngeal(NP) swabs in vial transport medium  Result Value Ref Range Status   SARS Coronavirus 2 by RT PCR POSITIVE (A) NEGATIVE Final    Comment: RESULT CALLED TO, READ BACK BY AND VERIFIED WITH: Marina Gravel @ [phone] ON 02/11/2021 C VARNER (NOTE) SARS-CoV-2 target nucleic acids are DETECTED.  The SARS-CoV-2 RNA is generally detectable in upper respiratory specimens during the acute phase of infection. Positive results are indicative of the presence of the identified virus, but do not rule out bacterial infection or co-infection with other pathogens not detected by the test. Clinical correlation with patient history and other diagnostic information is necessary to determine patient infection status. The expected result is Negative.  Fact Sheet for Patients: EntrepreneurPulse.com.au  Fact Sheet for Healthcare Providers: IncredibleEmployment.be  This test is not yet approved or cleared by the [place] FDA and  has been authorized for detection and/or diagnosis of SARS-CoV-2 by FDA under an Emergency Use Authorization (EUA).  This EUA will remain in effect (meaning this te st can be used) for the duration of  the COVID-19 declaration under Section 564(b)(1) of the Act, 21 U.S.C. section 360bbb-3(b)(1), unless the authorization is terminated or revoked  sooner.     Influenza A by PCR NEGATIVE NEGATIVE Final   Influenza B by PCR NEGATIVE NEGATIVE Final    Comment: (NOTE) The Xpert Xpress SARS-CoV-2/FLU/RSV plus assay is intended as an aid in the diagnosis of influenza from Nasopharyngeal swab specimens and should not be used as a sole basis for treatment. Nasal washings and aspirates are unacceptable for Xpert Xpress SARS-CoV-2/FLU/RSV testing.  Fact Sheet for Patients: EntrepreneurPulse.com.au  Fact Sheet for Healthcare Providers: IncredibleEmployment.be  This test is not yet approved or cleared by the [place] FDA and has been authorized for detection and/or diagnosis of SARS-CoV-2 by FDA under an Emergency Use Authorization (EUA). This EUA will remain in effect (meaning this test can be used) for the duration of the COVID-19 declaration under Section 564(b)(1) of the Act, 21 U.S.C. section 360bbb-3(b)(1), unless the authorization is terminated or revoked.  Performed at [facility], [place] [address] [place], [place] [address]   Blood Culture (routine x 2)     Status: None   Collection Time: 02/19/21  9:55 PM   Specimen: BLOOD  Result Value Ref Range Status   Specimen Description   Final    BLOOD BLOOD RIGHT FOREARM Performed at [place] [address], [place], [place] [address]    Special Requests   Final    BOTTLES DRAWN AEROBIC AND ANAEROBIC Blood Culture adequate volume Performed at [facility],  [place] [address], [place], [place] [address]    Culture   Final    NO GROWTH 5 DAYS Performed at [facility], [place] [address], [place], [place] [address]    Report Status 02/25/2021 FINAL  Final  Blood Culture (routine x 2)     Status: None   Collection Time: 02/19/21 10:10 PM   Specimen: BLOOD  Result Value Ref Range Status   Specimen Description   Final    BLOOD RIGHT ANTECUBITAL Performed at [place] [address], [place], [place] [address]    Special Requests   Final    BOTTLES DRAWN AEROBIC AND ANAEROBIC Blood Culture adequate volume Performed at [place] [address] [place], [place] [address]    Culture   Final    NO GROWTH 5 DAYS Performed at [facility], [place] [address], [place], [place] [address]    Report Status 02/25/2021 FINAL  Final  MRSA Next Gen by PCR, Nasal     Status: None   Collection Time: 02/26/21 12:27 PM   Specimen: Nasal Mucosa; Nasal Swab  Result Value Ref Range Status   MRSA by PCR Next Gen NOT DETECTED NOT DETECTED Final    Comment: (NOTE) The GeneXpert MRSA Assay (FDA approved for NASAL specimens only), is one component of a comprehensive MRSA colonization surveillance program. It is not intended to diagnose MRSA infection nor to guide or monitor treatment for MRSA infections. Test performance is not FDA approved in patients less than 36 years old. Performed at [facility], [place] [address] [place],  [address]          Radiology Studies: DG CHEST PORT 1 VIEW  Result Date: 02/26/2021 CLINICAL DATA:  Fever.  COVID. EXAM: PORTABLE CHEST 1 VIEW COMPARISON:  02/18/2021 FINDINGS: Normal heart size. Stable mediastinal contours. There are small bilateral pleural effusions with veil like opacification of the lower lung zones, new from previous exam. Decreased aeration of both lung bases may represent pneumonia and/or atelectasis. IMPRESSION: 1. New bilateral pleural effusions. 2. Decreased aeration of both lung bases may represent pneumonia and/or atelectasis. Electronically Signed   By: Kerby Moors M.D.   On: 02/26/2021 11:21        Scheduled Meds:  chlorhexidine  15 mL Mouth Rinse BID   enoxaparin (LOVENOX) injection  40 mg Subcutaneous Q24H   feeding supplement (PROSource TF)  45 mL Per Tube BID   free water  100 mL Per Tube Q4H   glycopyrrolate  0.1 mg Intravenous  Q6H   insulin aspart  0-6 Units Subcutaneous Q6H   insulin glargine-yfgn  10 Units Subcutaneous QHS   mouth rinse  15 mL Mouth Rinse q12n4p   Continuous Infusions:  sodium chloride 100 mL/hr at 02/26/21 1103   ceFEPime (MAXIPIME) IV 2 g (02/26/21 1306)   feeding supplement (OSMOLITE 1.2 CAL) 1,000 mL (02/25/21 0352)   potassium PHOSPHATE IVPB (in mmol) 30 mmol (02/26/21 1104)   [START ON 02/27/2021] vancomycin     vancomycin 1,500 mg (02/26/21 1420)     LOS: 5 days    Time spent: 40 minutes    Irine Seal, MD [facility]   To contact the attending provider between 7A-7P or the covering provider during after hours 7P-7A, please log into the web site [email or web address] and access using universal [place] password for that web site. If you do not have the password, please call the hospital operator.  02/26/2021, 3:56  PM

## 2021-02-26 NOTE — Progress Notes (Signed)
Pharmacy Antibiotic Note  Stanley Gilbert is a 88 y.o. male admitted on 02/16/2021 with  fever, fatigue, found to be COVID positive .  Pharmacy has been consulted for vancomycin and cefepime dosing.  Repeat CXR this AM w/ new bilateral pleural effusions, also w/ pneumonia vs atelectasis. Intermittently spiking fevers (this AM Tm 101.5)  Plan: Vancomycin '1500mg'$  IV x1, followed by Vancomycin '1250mg'$  IV q24 hours (eAUC 497, Scr round to 0.8, TBW for CrCl/Ke since TBW<IBW, Vd 0.7 L/kg) Cefepime 2g IV q12 hours (CrCl borderline for q8 dosing) F/u repeat cultures, MRSA PCR, strep pneumoniae and legionella antigens Monitor clinical improvement, ability to narrow abx  Height: '5\' 11"'$  (180.3 cm) Weight: 65.4 kg (144 lb 2.9 oz) IBW/kg (Calculated) : 75.3  Temp (24hrs), Avg:99.9 F (37.7 C), Min:98.7 F (37.1 C), Max:101.5 F (38.6 C)  Recent Labs  Lab 02/19/21 2200 02/14/2021 2330 02/22/21 0332 02/23/21 0319 02/24/21 0521 02/25/21 0340 02/26/21 0414  WBC 6.1 5.9 5.0 6.4 8.9  --  7.0  CREATININE 0.73 0.69 0.59* 0.59* 0.60* 0.56* 0.56*  LATICACIDVEN 2.6* 1.5  --   --   --   --   --     Estimated Creatinine Clearance: 59 mL/min (A) (by C-G formula based on SCr of 0.56 mg/dL (L)).    Allergies  Allergen Reactions   Sitagliptin     Other reaction(s): ABDOMINAL PAIN    Antimicrobials this admission: Vancomycin 8/18 >>  Cefepime 8/18 >>  Remdesivir 8/13 >> 8/17  Dose adjustments this admission: N/A  Microbiology results: 8/11 BCx: ngtd 8/18 Bcx: ordered 8/11 UCx: mult species 8/18 Ucx: ordered  8/18 MRSA PCR: ordered  Thank you for allowing pharmacy to be a part of this patient's care.  Dimple Nanas, PharmD 02/26/2021 12:08 PM

## 2021-02-26 NOTE — Care Management Important Message (Signed)
Important Message  Patient Details IM Letter given to the Patient. Name: GERONE GROSSHEIM MRN: [id number] Date of Birth: 01/14/1931   Medicare Important Message Given:  Yes     Kerin Salen 02/26/2021, 10:04 AM

## 2021-02-27 ENCOUNTER — Inpatient Hospital Stay (HOSPITAL_COMMUNITY): Payer: Medicare Other

## 2021-02-27 DIAGNOSIS — G9341 Metabolic encephalopathy: Secondary | ICD-10-CM | POA: Diagnosis not present

## 2021-02-27 DIAGNOSIS — Z7189 Other specified counseling: Secondary | ICD-10-CM | POA: Diagnosis not present

## 2021-02-27 DIAGNOSIS — Z515 Encounter for palliative care: Secondary | ICD-10-CM | POA: Diagnosis not present

## 2021-02-27 DIAGNOSIS — R1310 Dysphagia, unspecified: Secondary | ICD-10-CM | POA: Diagnosis not present

## 2021-02-27 DIAGNOSIS — F0390 Unspecified dementia without behavioral disturbance: Secondary | ICD-10-CM | POA: Diagnosis not present

## 2021-02-27 DIAGNOSIS — U071 COVID-19: Secondary | ICD-10-CM | POA: Diagnosis not present

## 2021-02-27 LAB — RENAL FUNCTION PANEL
Albumin: 2.4 g/dL — ABNORMAL LOW (ref 3.5–5.0)
Anion gap: 7 (ref 5–15)
BUN: 17 mg/dL (ref 8–23)
CO2: 24 mmol/L (ref 22–32)
Calcium: 7.6 mg/dL — ABNORMAL LOW (ref 8.9–10.3)
Chloride: 106 mmol/L (ref 98–111)
Creatinine, Ser: 0.52 mg/dL — ABNORMAL LOW (ref 0.61–1.24)
GFR, Estimated: >60 mL/min (ref >60)
Glucose, Bld: 133 mg/dL — ABNORMAL HIGH (ref 70–99)
Phosphorus: 2 mg/dL — ABNORMAL LOW (ref 2.5–4.6)
Potassium: 3.3 mmol/L — ABNORMAL LOW (ref 3.5–5.1)
Sodium: 137 mmol/L (ref 135–145)

## 2021-02-27 LAB — CBC WITH DIFFERENTIAL/PLATELET
Abs Immature Granulocytes: 0.04 10*3/uL (ref 0.00–0.07)
Basophils Absolute: 0 10*3/uL (ref 0.0–0.1)
Basophils Relative: 0 %
Eosinophils Absolute: 0 10*3/uL (ref 0.0–0.5)
Eosinophils Relative: 0 %
HCT: 33.5 % — ABNORMAL LOW (ref 39.0–52.0)
Hemoglobin: 11.3 g/dL — ABNORMAL LOW (ref 13.0–17.0)
Immature Granulocytes: 1 %
Lymphocytes Relative: 8 %
Lymphs Abs: 0.7 10*3/uL (ref 0.7–4.0)
MCH: 31.4 pg (ref 26.0–34.0)
MCHC: 33.7 g/dL (ref 30.0–36.0)
MCV: 93.1 fL (ref 80.0–100.0)
Monocytes Absolute: 0.7 10*3/uL (ref 0.1–1.0)
Monocytes Relative: 8 %
Neutro Abs: 6.9 10*3/uL (ref 1.7–7.7)
Neutrophils Relative %: 83 %
Platelets: 196 10*3/uL (ref 150–400)
RBC: 3.6 MIL/uL — ABNORMAL LOW (ref 4.22–5.81)
RDW: 13.3 % (ref 11.5–15.5)
WBC: 8.3 10*3/uL (ref 4.0–10.5)
nRBC: 0 % (ref 0.0–0.2)

## 2021-02-27 LAB — GLUCOSE, CAPILLARY
Glucose-Capillary: 110 mg/dL — ABNORMAL HIGH (ref 70–99)
Glucose-Capillary: 129 mg/dL — ABNORMAL HIGH (ref 70–99)
Glucose-Capillary: 129 mg/dL — ABNORMAL HIGH (ref 70–99)
Glucose-Capillary: 140 mg/dL — ABNORMAL HIGH (ref 70–99)
Glucose-Capillary: 165 mg/dL — ABNORMAL HIGH (ref 70–99)
Glucose-Capillary: 171 mg/dL — ABNORMAL HIGH (ref 70–99)
Glucose-Capillary: 230 mg/dL — ABNORMAL HIGH (ref 70–99)

## 2021-02-27 LAB — LEGIONELLA PNEUMOPHILA SEROGP 1 UR AG: L. pneumophila Serogp 1 Ur Ag: NEGATIVE

## 2021-02-27 LAB — STREP PNEUMONIAE URINARY ANTIGEN: Strep Pneumo Urinary Antigen: NEGATIVE

## 2021-02-27 LAB — MAGNESIUM: Magnesium: 2.1 mg/dL (ref 1.7–2.4)

## 2021-02-27 LAB — AMMONIA: Ammonia: 19 umol/L (ref 9–35)

## 2021-02-27 MED ORDER — POTASSIUM CHLORIDE 10 MEQ/100ML IV SOLN
10.0000 meq | INTRAVENOUS | Status: AC
  Administered 2021-02-27 (×4): 10 meq via INTRAVENOUS
  Filled 2021-02-27 (×4): qty 100

## 2021-02-27 MED ORDER — POTASSIUM PHOSPHATES 15 MMOLE/5ML IV SOLN
30.0000 mmol | Freq: Once | INTRAVENOUS | Status: AC
  Administered 2021-02-27: 30 mmol via INTRAVENOUS
  Filled 2021-02-27: qty 10

## 2021-02-27 NOTE — Progress Notes (Signed)
Daily Progress Note   Patient Name: Stanley Gilbert       Date: 02/27/2021 DOB: [birth year]  Age: 87 y.o. MRN#: [id number] Attending Physician: Eugenie Filler, MD Primary Care Physician: [name], MD Admit Date: 02/28/2021  Reason for Consultation/Follow-up: Establishing goals of care  Subjective: Patient is resting in bed.  Appears overall unchanged, wife not at bedside this am.    Length of Stay: 6  Current Medications: Scheduled Meds:   chlorhexidine  15 mL Mouth Rinse BID   enoxaparin (LOVENOX) injection  40 mg Subcutaneous Q24H   feeding supplement (PROSource TF)  45 mL Per Tube BID   free water  100 mL Per Tube Q4H   glycopyrrolate  0.1 mg Intravenous Q6H   insulin aspart  0-6 Units Subcutaneous Q6H   insulin glargine-yfgn  10 Units Subcutaneous QHS   mouth rinse  15 mL Mouth Rinse q12n4p    Continuous Infusions:  sodium chloride 100 mL/hr at 02/27/21 0420   ceFEPime (MAXIPIME) IV 2 g (02/26/21 2111)   feeding supplement (OSMOLITE 1.2 CAL) 1,000 mL (02/25/21 0352)   potassium chloride 10 mEq (02/27/21 1100)   potassium PHOSPHATE IVPB (in mmol)     vancomycin      PRN Meds: acetaminophen, acetaminophen, docusate **AND** sennosides, guaiFENesin-dextromethorphan, hydrALAZINE, lip balm, ondansetron **OR** ondansetron (ZOFRAN) IV  Physical Exam         Appears frail chronically ill Shallow breath sounds  Does not verbalize, does not follow commands 1-2 + both lower extremities edema   Vital Signs: BP (!) 147/61 (BP Location: Right Arm)   Pulse 89   Temp (!) 100.7 F (38.2 C) (Oral)   Resp (!) 28   Ht '5\' 11"'$  (1.803 m)   Wt 67.3 kg   SpO2 93%   BMI 20.69 kg/m  SpO2: SpO2: 93 % O2 Device: O2 Device: Room Air O2 Flow Rate:    Intake/output summary:   Intake/Output Summary (Last 24 hours) at 02/27/2021 1204 Last data filed at 02/27/2021 0420 Gross per 24 hour  Intake --  Output 300 ml  Net -300 ml    LBM: Last BM Date: 02/23/21 Baseline Weight: Weight: 63.6 kg Most recent weight: Weight: 67.3 kg Palliative performance scale 30%.      Palliative Assessment/Data:    Flowsheet Rows  Flowsheet Row Most Recent Value  Intake Tab   Referral Department Hospitalist  Unit at Time of Referral Med/Surg Unit  Palliative Care Primary Diagnosis Sepsis/Infectious Disease  Date Notified 02/22/21  Reason for referral Clarify Goals of Care  Date of Admission 03/09/2021  Date first seen by Palliative Care 02/23/21  # of days Palliative referral response time 1 Day(s)  # of days IP prior to Palliative referral 2  Clinical Assessment   Palliative Performance Scale Score 10%  Psychosocial & Spiritual Assessment   Palliative Care Outcomes   Patient/Family meeting held? Yes  Who was at the meeting? Wife       Patient Active Problem List   Diagnosis Date Noted   Acute metabolic encephalopathy [id number]   Fever 02/26/2021   Hypomagnesemia    Hypophosphatemia    Hypokalemia    Dysphagia    Feeding difficulties    Weakness    Palliative care by specialist    Goals of care, counseling/discussion    Pressure injury of skin 02/23/2021   COVID-19 02/21/2021   Dementia ([place])    Acute encephalopathy    Type 2 diabetes mellitus with hypoglycemia without coma ([place]) 07/27/2013   Psoriasis and similar disorder 10/23/2012    Palliative Care Assessment & Plan   Patient Profile:    Assessment: 87 year old gentleman who lives at home with his wife with a history of dementia admitted with generalized weakness anorexia increasing confusion and fever.  Patient admitted to hospital medicine service for COVID-pneumonia, electrolyte abnormalities and hospital course complicated by ongoing dysphagia.  Patient now on tube feeds via NG tube.   Palliative services following for goals of care discussions.  Recommendations/Plan: MRI noted to have no acute intracranial abnormality and diffuse, severe atrophy and chronic small vessel disease. Now on broad spectrum antibiotics SLP following.  Continue to monitor hospital course.       Goals of Care and Additional Recommendations: Limitations on Scope of Treatment: Full Scope Treatment  Code Status:    Code Status Orders  (From admission, onward)           Start     Ordered   02/21/21 0142  Do not attempt resuscitation (DNR)  Continuous       Question Answer Comment  In the event of cardiac or respiratory ARREST Do not call a "code blue"   In the event of cardiac or respiratory ARREST Do not perform Intubation, CPR, defibrillation or ACLS   In the event of cardiac or respiratory ARREST Use medication by any route, position, wound care, and other measures to relive pain and suffering. May use oxygen, suction and manual treatment of airway obstruction as needed for comfort.      02/21/21 0143           Code Status History     This patient has a current code status but no historical code status.       Prognosis: Guarded  Discharge Planning: To be determined.    Care plan was discussed with IDT  Thank you for allowing the Palliative Medicine Team to assist in the care of this patient.   Time In: 10 Time Out: 10.15 Total Time 15 Prolonged Time Billed  no       Greater than 50%  of this time was spent counseling and coordinating care related to the above assessment and plan.  [name], MD  Please contact Palliative Medicine Team phone at [phone] for questions and concerns.

## 2021-02-27 NOTE — Progress Notes (Addendum)
SLP Cancellation Note  Patient Details Name: Stanley Gilbert MRN: [id number] DOB: 1930/08/21   Cancelled treatment:       Reason Eval/Treat Not Completed: Other (comment) (SLP requested RN message when pt present, within 10 minutes of SLP arriving to work - went to pt's room with RN, - pt remains lethargic with open mouth posture - opened eyes minimally briefly.  Requested RN message SLP when wife is present.   Stanley Lime, MS [facility] SLP Acute Rehab Services Office [phone] Pager [phone]    Macario Golds 02/27/2021, 8:49 AM

## 2021-02-27 NOTE — Progress Notes (Signed)
PROGRESS NOTE    Stanley Gilbert  [id number] DOB: [birth year] DOA: 03/08/2021 PCP: [name], MD    Chief Complaint  Patient presents with   Fever   Fatigue   Covid Positive    Brief Narrative:   87 year old male with history of dementia type 2 diabetes admitted with generalized weakness anorexia increased confusion and fever.  Patient lives with his wife for 59 years at home.  Per his wife prior to admission he was able to ambulate in the house, feed himself and use the bathroom on his own.   He was found to be COVID-positive.  Head CT shows no acute findings.  Patient has been having decreased p.o. intake for 2 days prior to admission.  She is trying to get home health at home so he can she can take him home.  She wants him to eat again.  He was seen by speech therapy and he is a high aspiration risk and has recommended NPO.  Wife requesting to have PEG tube placed for nutrition so she can take him home.  I have consulted palliative care.  Wife reports that she has promised her husband that she will never put him in the nursing home and that she will take care of him at home till he passes away. She is 87 years old and she is tired and she is trying to get help at home to help her with taking care of him.  She has a daughter who lives in the state.     Assessment & Plan:   Principal Problem:   COVID-19 Active Problems:   Fever   Acute metabolic encephalopathy   Type 2 diabetes mellitus with hypoglycemia without coma (HCC)   Dementia (HCC)   Acute encephalopathy   Pressure injury of skin   Feeding difficulties   Weakness   Palliative care by specialist   Goals of care, counseling/discussion   Hypomagnesemia   Hypophosphatemia   Hypokalemia   Dysphagia  #1 COVID-pneumonia -Status post full course remdesivir. -Chest x-ray done initially did show some left basilar infiltrates patient was not on antibiotics as likely secondary to COVID-pneumonia. -Supportive  care.  2.  Fever -Patient noted to be spiking fevers with a temp of 101 this morning.  -Patient minimally responsive to noxious stimuli with ongoing acute encephalopathy despite treatment of COVID-19.   -Blood cultures ordered and pending with no growth to date.   -Repeat chest x-ray done with new bilateral pleural effusions, decreased aeration of both lung bases may represent pneumonia and/or atelectasis. -Urine Legionella antigen negative.  Urine pneumococcus antigen negative.   -Repeat urinalysis nitrite negative, leukocytes negative, 0-5 WBCs.   -Patient minimally responsive, with ongoing acute encephalopathy despite treatment of COVID-pneumonia. -Continue empiric IV vancomycin, IV cefepime. -If patient with continued fevers we will add IV Flagyl and will likely need further imaging/CT abdomen and pelvis for further evaluation. -Supportive care.  3.  Acute metabolic encephalopathy -Initially felt likely secondary to COVID-pneumonia in the setting of dementia.  Also concern for infectious etiology as patient spiking fevers. -Patient not improving clinically.   -CT head done on admission negative. -Family concerned for acute CVA and requesting MRI brain be done. -MRI brain negative for any acute abnormalities  -Ammonia levels within normal limits.   -Patient pancultured and results pending.   -Patient in current state unable to tolerate LP and likely risks of LP at this time outweigh benefits.   -Continue empiric IV vancomycin, IV cefepime.   -  Supportive care.    4.  Hypokalemia/hypomagnesemia/hypophosphatemia -Likely secondary to poor oral intake. -Potassium at 3.3, magnesium at 2.1, phosphorus at 2.0 -K-Phos 30 mmol IV x1 -KCl 10 mEq IV every hour x4 hours. -Repeat labs in the morning.  5.  Diabetes mellitus type 2 -Patient with poor oral intake. -Patient started on tube feeds at family's insistence and patient pulled out NG tube with tube and hand on 02/25/2021. -Hemoglobin A1c  7.3 (02/21/2021) -CBG 110 this morning.   -Continue to hold glimepiride.   -Continue Lantus, SSI.   -Continue CBGs every 6 hours.    6.  History of dementia -Patient with history of dementia, has been declining, lives at home with his wife who is 27 years old and they have been married for 81 years. -Per Dr. Zigmund  in discussion with wife patient at baseline able to ambulate in the house, feed himself, use the restroom until he was recently admitted during this hospitalization. -Patient with poor oral intake, NG tube placed however patient pulled out NG tube. -We will place NG tube back in at the insistence of family and consult with dietitian to initiate tube feeds again. -Palliative care consulted however seems as if family is resistant to hospice at this time or palliation and wanting to take patient home with home health. -Palliative care following. -Follow.  7.  Goals of care -Patient is DNR, patient with dementia with poor oral intake recently.  Small bore NG tube placed 02/23/2021 for nutrition which was supposed to be a temporary trial while patient was n.p.o. as he is high aspiration risk. -Patient pulled out NG tube. -Monitor over the next 24 hours. -If no significant improvement will need to discuss with wife regarding reassessment of hospice versus palliation. -It is noted per Dr. Zigmund  that wife does not want to hear palliative or hospice at this time and very adamant that patient will not go to a nursing home but will go home when she can take care of him in his final days. -Patient has been declining, spoke with wife again who feels further work-up needed in terms of her MRI for further evaluation and at this time not ready to transition to full comfort measures. -Wife however is open to home with hospice if patient was to continue decline with no significant improvement. -Wife is open to palliative care following while in-house.  8.  High risk aspiration/dysphagia -NG  tube was placed on 02/23/2021 for feeding however patient pulled out NG tube 02/25/2021.   -SLP asked to reassess however patient minimally responsive to noxious stimuli. -Wife wants NG tube to be placed back in however concerned that patient may dislodge it again. -NG tube/feeding tube to be placed back in today and tube feeds to be resumed.  9.  Pressure injury, POA Pressure Injury 02/21/21 Buttocks Left Stage 2 -  Partial thickness loss of dermis presenting as a shallow open injury with a red, pink wound bed without slough. (Active)  02/21/21 1700  Location: Buttocks  Location Orientation: Left  Staging: Stage 2 -  Partial thickness loss of dermis presenting as a shallow open injury with a red, pink wound bed without slough.  Wound Description (Comments):   Present on Admission: Yes         DVT prophylaxis: Lovenox Code Status: DNR Family Communication: No family at bedside.  Disposition:   Status is: Inpatient  Remains inpatient appropriate because:Persistent severe electrolyte disturbances  Dispo: The patient is from: Home  Anticipated d/c is to: Home              Patient currently is not medically stable to d/c.   Difficult to place patient No       Consultants:  Palliative care: Dr. Domingo Cocking 02/23/2021  Procedures:  CT head 02/19/2021 Chest x-ray 02/19/2021, 03/08/2021, 02/26/2021 Abdominal films 02/23/2021 MRI head 02/26/2021  Antimicrobials:  IV remdesivir 02/21/2021>>>>> 02/25/2021 IV vancomycin 02/26/2021>>>>> IV cefepime 02/26/2021>>>>>    Subjective: Patient minimally responsive to noxious stimuli.  No significant change over the past 24 hours.   Objective: Vitals:   02/27/21 0037 02/27/21 0512 02/27/21 0600 02/27/21 1122  BP: 134/63  (!) 112/56 (!) 147/61  Pulse: 85  73 89  Resp: 18  (!) 21 (!) 28  Temp: 99.9 F (37.7 C) (!) 101 F (38.3 C) 100 F (37.8 C) (!) 100.7 F (38.2 C)  TempSrc: Oral Rectal Axillary Oral  SpO2: 91%  93% 93%   Weight:  67.3 kg    Height:        Intake/Output Summary (Last 24 hours) at 02/27/2021 1122 Last data filed at 02/27/2021 0420 Gross per 24 hour  Intake --  Output 600 ml  Net -600 ml    Filed Weights   02/25/21 0355 02/26/21 0435 02/27/21 0512  Weight: 67.2 kg 65.4 kg 67.3 kg    Examination:  General exam: Dry mucous membranes.  Frail.  Minimally responsive to noxious stimuli Respiratory system: Clear to auscultation anterior lung fields.  Decreased gurgling.  No wheezes, no rhonchi.  Fair air movement.  Cardiovascular system: RRR no murmurs rubs or gallops.  No JVD.  No lower extremity edema. Gastrointestinal system: Abdomen is soft, nondistended, nontender, positive bowel sounds.  No rebound.  No guarding. Central nervous system: Minimally responsive to noxious stimuli.  Moving extremities spontaneously.  Extremities: Symmetric 5 x 5 power. Skin: No rashes, lesions or ulcers Psychiatry: Judgement and insight appear poor. Mood & affect unable to assess.    Data Reviewed: I have personally reviewed following labs and imaging studies  CBC: Recent Labs  Lab 03/09/2021 2330 02/22/21 0332 02/23/21 0319 02/24/21 0521 02/26/21 0414 02/27/21 0331  WBC 5.9 5.0 6.4 8.9 7.0 8.3  NEUTROABS 3.7 3.6 5.0 7.2  --  6.9  HGB 11.7* 12.4* 12.8* 12.4* 11.7* 11.3*  HCT 36.1* 36.8* 39.0 36.8* 35.1* 33.5*  MCV 96.8 93.9 94.4 93.4 93.1 93.1  PLT 162 168 164 156 167 196     Basic Metabolic Panel: Recent Labs  Lab 02/23/21 0319 02/23/21 1626 02/24/21 0521 02/24/21 1628 02/25/21 0337 02/25/21 0340 02/26/21 0414 02/27/21 0331  NA 134*  --  133*  --   --  132* 136 137  K 4.1  --  3.2*  --   --  3.3* 3.9 3.3*  CL 101  --  100  --   --  100 102 106  CO2 26  --  23  --   --  '25 26 24  '$ GLUCOSE 118*  --  215*  --   --  277* 160* 133*  BUN 11  --  16  --   --  '15 15 17  '$ CREATININE 0.59*  --  0.60*  --   --  0.56* 0.56* 0.52*  CALCIUM 7.9*  --  7.9*  --   --  7.6* 7.7* 7.6*  MG 1.8   --  1.6*  --   --  2.0 1.8 2.1  PHOS 2.7   < > 2.4*  1.2* 1.4*  --  2.1* 2.0*   < > = values in this interval not displayed.     GFR: Estimated Creatinine Clearance: 60.8 mL/min (A) (by C-G formula based on SCr of 0.52 mg/dL (L)).  Liver Function Tests: Recent Labs  Lab 02/16/2021 2330 02/22/21 0332 02/23/21 0319 02/24/21 0521 02/26/21 0414 02/27/21 0331  AST '26 29 31 26  '$ --   --   ALT '17 21 23 23  '$ --   --   ALKPHOS 49 54 52 45  --   --   BILITOT 0.5 0.7 0.8 1.0  --   --   PROT 5.9* 6.2* 6.1* 5.6*  --   --   ALBUMIN 3.2* 3.3* 3.1* 2.8* 2.5* 2.4*     CBG: Recent Labs  Lab 02/26/21 2031 02/27/21 0036 02/27/21 0501 02/27/21 0730 02/27/21 1118  GLUCAP 140* 165* 129* 110* 129*      Recent Results (from the past 240 hour(s))  Urine Culture     Status: Abnormal   Collection Time: 02/19/21  9:45 PM   Specimen: In/Out Cath Urine  Result Value Ref Range Status   Specimen Description   Final    IN/OUT CATH URINE Performed at [facility], [place] [address] [place], [place] [address]    Special Requests   Final    NONE Performed at [facility], [place] [address], [place], [place] [address]    Culture MULTIPLE SPECIES PRESENT, SUGGEST RECOLLECTION (A)  Final   Report Status 02/21/2021 FINAL  Final  Resp Panel by RT-PCR (Flu A&B, Covid) Nasopharyngeal Swab     Status: Abnormal   Collection Time: 02/19/21  9:46 PM   Specimen: Nasopharyngeal Swab; Nasopharyngeal(NP) swabs in vial transport medium  Result Value Ref Range Status   SARS Coronavirus 2 by RT PCR POSITIVE (A) NEGATIVE Final    Comment: RESULT CALLED TO, READ BACK BY AND VERIFIED WITH: Marina Gravel @ [id number] ON 02/14/2021 C VARNER (NOTE) SARS-CoV-2 target nucleic acids are DETECTED.  The SARS-CoV-2 RNA is generally detectable in upper respiratory specimens during the acute phase of infection. Positive results are indicative of the presence of the identified virus, but do not  rule out bacterial infection or co-infection with other pathogens not detected by the test. Clinical correlation with patient history and other diagnostic information is necessary to determine patient infection status. The expected result is Negative.  Fact Sheet for Patients: EntrepreneurPulse.com.au  Fact Sheet for Healthcare Providers: IncredibleEmployment.be  This test is not yet approved or cleared by the [place] FDA and  has been authorized for detection and/or diagnosis of SARS-CoV-2 by FDA under an Emergency Use Authorization (EUA).  This EUA will remain in effect (meaning this te st can be used) for the duration of  the COVID-19 declaration under Section 564(b)(1) of the Act, 21 U.S.C. section 360bbb-3(b)(1), unless the authorization is terminated or revoked sooner.     Influenza A by PCR NEGATIVE NEGATIVE Final   Influenza B by PCR NEGATIVE NEGATIVE Final    Comment: (NOTE) The Xpert Xpress SARS-CoV-2/FLU/RSV plus assay is intended as an aid in the diagnosis of influenza from Nasopharyngeal swab specimens and should not be used as a sole basis for treatment. Nasal washings and aspirates are unacceptable for Xpert Xpress SARS-CoV-2/FLU/RSV testing.  Fact Sheet for Patients: EntrepreneurPulse.com.au  Fact Sheet for Healthcare Providers: IncredibleEmployment.be  This test is not yet approved or cleared by the [place] FDA and has been authorized for detection and/or  diagnosis of SARS-CoV-2 by FDA under an Emergency Use Authorization (EUA). This EUA will remain in effect (meaning this test can be used) for the duration of the COVID-19 declaration under Section 564(b)(1) of the Act, 21 U.S.C. section 360bbb-3(b)(1), unless the authorization is terminated or revoked.  Performed at [facility], [place] [address] [place], [place] [address]   Blood Culture (routine x 2)      Status: None   Collection Time: 02/19/21  9:55 PM   Specimen: BLOOD  Result Value Ref Range Status   Specimen Description   Final    BLOOD BLOOD RIGHT FOREARM Performed at [place] [address] [place], [place] [address]    Special Requests   Final    BOTTLES DRAWN AEROBIC AND ANAEROBIC Blood Culture adequate volume Performed at [place] [address] [place], [place] [address]    Culture   Final    NO GROWTH 5 DAYS Performed at [facility], [place] [address] [place], [place] [address]    Report Status 02/25/2021 FINAL  Final  Blood Culture (routine x 2)     Status: None   Collection Time: 02/19/21 10:10 PM   Specimen: BLOOD  Result Value Ref Range Status   Specimen Description   Final    BLOOD RIGHT ANTECUBITAL Performed at [place] [address], [place], [place] [address]    Special Requests   Final    BOTTLES DRAWN AEROBIC AND ANAEROBIC Blood Culture adequate volume Performed at [place] [address] [place], [place] [address]    Culture   Final    NO GROWTH 5 DAYS Performed at [facility], [place] [address], [place], [place] [address]    Report Status 02/25/2021 FINAL  Final  MRSA Next Gen by PCR, Nasal     Status: None   Collection Time: 02/26/21 12:27 PM   Specimen: Nasal Mucosa; Nasal Swab  Result Value Ref Range Status   MRSA by PCR Next Gen NOT DETECTED NOT DETECTED Final    Comment: (NOTE) The GeneXpert MRSA Assay (FDA approved for NASAL specimens only), is one component of a comprehensive MRSA colonization surveillance program. It is not intended to diagnose MRSA infection nor to guide or monitor treatment for MRSA infections. Test performance is not FDA approved in patients less than 41 years old. Performed at [facility], [place] [address], [place], [place] [address]   Culture, blood (Routine X 2) w Reflex to ID Panel      Status: None (Preliminary result)   Collection Time: 02/26/21 12:32 PM   Specimen: BLOOD  Result Value Ref Range Status   Specimen Description   Final    BLOOD LEFT HAND Performed at [place] [address] [place], [place] [address]    Special Requests   Final    BOTTLES DRAWN AEROBIC AND ANAEROBIC Blood Culture adequate volume Performed at [place] [address] [place], [place] [address]    Culture   Final    NO GROWTH < 12 HOURS Performed at [place] [address] [place], [place] [address]    Report Status PENDING  Incomplete  Culture, blood (Routine X 2) w Reflex to ID Panel     Status: None (Preliminary result)   Collection Time: 02/26/21 12:33 PM   Specimen: BLOOD  Result Value Ref Range Status   Specimen Description  Final    BLOOD RIGHT HAND Performed at [facility], [place] [address] [place], [place] [address]    Special Requests   Final    BOTTLES DRAWN AEROBIC AND ANAEROBIC Blood Culture adequate volume Performed at [place] [address], [place], [place] [address]    Culture   Final    NO GROWTH < 12 HOURS Performed at [place] [address], [place], [place] [address]    Report Status PENDING  Incomplete          Radiology Studies: MR BRAIN WO CONTRAST  Result Date: 02/27/2021 CLINICAL DATA:  Transient ischemic attack EXAM: MRI HEAD WITHOUT CONTRAST TECHNIQUE: Multiplanar, multiecho pulse sequences of the brain and surrounding structures were obtained without intravenous contrast. COMPARISON:  09/01/2019 FINDINGS: Brain: No acute infarct, mass effect or extra-axial collection. No acute or chronic hemorrhage. There is multifocal hyperintense T2-weighted signal within the white matter. Diffuse, severe atrophy. The midline structures are normal. Vascular: Major flow voids are preserved. Skull and upper cervical spine: Normal calvarium and  skull base. Visualized upper cervical spine and soft tissues are normal. Sinuses/Orbits:No paranasal sinus fluid levels or advanced mucosal thickening. No mastoid or middle ear effusion. Normal orbits. IMPRESSION: 1. No acute intracranial abnormality. 2. Diffuse, severe atrophy and chronic small vessel disease. Electronically Signed   By: Ulyses Jarred M.D.   On: 02/27/2021 00:21   DG CHEST PORT 1 VIEW  Result Date: 02/26/2021 CLINICAL DATA:  Fever.  COVID. EXAM: PORTABLE CHEST 1 VIEW COMPARISON:  02/09/2021 FINDINGS: Normal heart size. Stable mediastinal contours. There are small bilateral pleural effusions with veil like opacification of the lower lung zones, new from previous exam. Decreased aeration of both lung bases may represent pneumonia and/or atelectasis. IMPRESSION: 1. New bilateral pleural effusions. 2. Decreased aeration of both lung bases may represent pneumonia and/or atelectasis. Electronically Signed   By: Kerby Moors M.D.   On: 02/26/2021 11:21        Scheduled Meds:  chlorhexidine  15 mL Mouth Rinse BID   enoxaparin (LOVENOX) injection  40 mg Subcutaneous Q24H   feeding supplement (PROSource TF)  45 mL Per Tube BID   free water  100 mL Per Tube Q4H   glycopyrrolate  0.1 mg Intravenous Q6H   insulin aspart  0-6 Units Subcutaneous Q6H   insulin glargine-yfgn  10 Units Subcutaneous QHS   mouth rinse  15 mL Mouth Rinse q12n4p   Continuous Infusions:  sodium chloride 100 mL/hr at 02/27/21 0420   ceFEPime (MAXIPIME) IV 2 g (02/26/21 2111)   feeding supplement (OSMOLITE 1.2 CAL) 1,000 mL (02/25/21 0352)   potassium chloride 10 mEq (02/27/21 1100)   potassium PHOSPHATE IVPB (in mmol)     vancomycin       LOS: 6 days    Time spent: 40 minutes    Irine Seal, MD [facility]   To contact the attending provider between 7A-7P or the covering provider during after hours 7P-7A, please log into the web site [email or web address] and access using universal [place]  password for that web site. If you do not have the password, please call the hospital operator.  02/27/2021, 11:22 AM

## 2021-02-27 NOTE — Progress Notes (Signed)
  Talked to pt's daughter fr [name] [id number])  on the phone  she was downstairs wanting to visit but didn't allow her due to [facility] visitation policy. She's on the contact list. She wants some information about whats going on with her dad. Dr. [place] Silos and Dr. Rowe Pavy aware.

## 2021-02-27 NOTE — Progress Notes (Signed)
PMT no charge note  Received notification from bedside RN about patient's daughter [name] wanting to correspond with medical staff about her father's condition.  She is listed on the contact list.  Call placed and discussed with daughter [name] at [phone].  She lives in [place], [place]. [name] was able to give a brief overview of the patient's baseline prior to this hospitalization.  She states that he had been declining cognitively as well as functionally from a dementia standpoint.  Patient's wife and daughter were in the process of finding a memory care facility for the patient to go to.  She states that the patient has completed advance care planning documents at his primary care physician's office he has completed a MOST form and it was his wish to not be tied to any tubes long-term. We discussed about scope of current hospitalization.  Discussed extensively about patient being on treatments for COVID-pneumonia, broad-spectrum antibiotics, time-limited trial of nutrition and that overall prognosis appears to be guarded and the patient has a high risk for ongoing decline in decompensation in spite of of best measures.  She is well aware of the patient's background health as well as current condition.  Ms. [name] herself has a medical background and works in sports medicine. Plan: Continue current mode of care for the next 48 hours or so. [name] agrees that after this time-limited trial, if there is no meaningful/significant stabilization or recovery, then, best mode of care will be to pursue full scope of comfort measures and transfer to residential hospice.  She will work with [place] to make her mother, patient's wife [name] understand about hospice and comfort care should that become the most appropriate step to take. PMT to follow No charge [name] MD [facility] health palliative.

## 2021-02-28 DIAGNOSIS — U071 COVID-19: Secondary | ICD-10-CM | POA: Diagnosis not present

## 2021-02-28 DIAGNOSIS — R1310 Dysphagia, unspecified: Secondary | ICD-10-CM | POA: Diagnosis not present

## 2021-02-28 DIAGNOSIS — G9341 Metabolic encephalopathy: Secondary | ICD-10-CM | POA: Diagnosis not present

## 2021-02-28 DIAGNOSIS — F0390 Unspecified dementia without behavioral disturbance: Secondary | ICD-10-CM | POA: Diagnosis not present

## 2021-02-28 LAB — URINE CULTURE: Culture: NO GROWTH

## 2021-02-28 LAB — GLUCOSE, CAPILLARY
Glucose-Capillary: 219 mg/dL — ABNORMAL HIGH (ref 70–99)
Glucose-Capillary: 273 mg/dL — ABNORMAL HIGH (ref 70–99)
Glucose-Capillary: 288 mg/dL — ABNORMAL HIGH (ref 70–99)

## 2021-02-28 LAB — BASIC METABOLIC PANEL
Anion gap: 6 (ref 5–15)
BUN: 17 mg/dL (ref 8–23)
CO2: 23 mmol/L (ref 22–32)
Calcium: 7.5 mg/dL — ABNORMAL LOW (ref 8.9–10.3)
Chloride: 105 mmol/L (ref 98–111)
Creatinine, Ser: 0.46 mg/dL — ABNORMAL LOW (ref 0.61–1.24)
GFR, Estimated: >60 mL/min (ref >60)
Glucose, Bld: 223 mg/dL — ABNORMAL HIGH (ref 70–99)
Potassium: 3.9 mmol/L (ref 3.5–5.1)
Sodium: 134 mmol/L — ABNORMAL LOW (ref 135–145)

## 2021-02-28 LAB — PHOSPHORUS: Phosphorus: 1.7 mg/dL — ABNORMAL LOW (ref 2.5–4.6)

## 2021-02-28 MED ORDER — INSULIN GLARGINE-YFGN 100 UNIT/ML ~~LOC~~ SOLN
14.0000 [IU] | Freq: Every day | SUBCUTANEOUS | Status: DC
  Administered 2021-02-28: 14 [IU] via SUBCUTANEOUS
  Filled 2021-02-28: qty 0.14

## 2021-02-28 MED ORDER — POTASSIUM PHOSPHATES 15 MMOLE/5ML IV SOLN
30.0000 mmol | Freq: Once | INTRAVENOUS | Status: AC
  Administered 2021-02-28: 30 mmol via INTRAVENOUS
  Filled 2021-02-28: qty 10

## 2021-02-28 MED ORDER — SODIUM CHLORIDE 0.9 % IV SOLN
2.0000 g | Freq: Three times a day (TID) | INTRAVENOUS | Status: DC
  Administered 2021-02-28 – 2021-03-04 (×11): 2 g via INTRAVENOUS
  Filled 2021-02-28 (×13): qty 2

## 2021-02-28 MED ORDER — GLYCOPYRROLATE 0.2 MG/ML IJ SOLN
0.1000 mg | Freq: Four times a day (QID) | INTRAMUSCULAR | Status: AC | PRN
  Administered 2021-02-28 – 2021-03-01 (×2): 0.1 mg via INTRAVENOUS
  Filled 2021-02-28 (×2): qty 1

## 2021-02-28 MED ORDER — METOPROLOL TARTRATE 5 MG/5ML IV SOLN
2.5000 mg | Freq: Once | INTRAVENOUS | Status: AC
  Administered 2021-02-28: 2.5 mg via INTRAVENOUS
  Filled 2021-02-28: qty 5

## 2021-02-28 NOTE — Plan of Care (Signed)
  Problem: Education: Goal: Knowledge of General Education information will improve Description: Including pain rating scale, medication(s)/side effects and non-pharmacologic comfort measures Outcome: Not Progressing   

## 2021-02-28 NOTE — Progress Notes (Signed)
PROGRESS NOTE    Stanley Gilbert  [id number] DOB: 06-11-1931 DOA: 02/23/2021 PCP: [name], MD    Chief Complaint  Patient presents with   Fever   Fatigue   Covid Positive    Brief Narrative:   87 year old male with history of dementia type 2 diabetes admitted with generalized weakness anorexia increased confusion and fever.  Patient lives with his wife for 59 years at home.  Per his wife prior to admission he was able to ambulate in the house, feed himself and use the bathroom on his own.   He was found to be COVID-positive.  Head CT shows no acute findings.  Patient has been having decreased p.o. intake for 2 days prior to admission.  She is trying to get home health at home so he can she can take him home.  She wants him to eat again.  He was seen by speech therapy and he is a high aspiration risk and has recommended NPO.  Wife requesting to have PEG tube placed for nutrition so she can take him home.  I have consulted palliative care.  Wife reports that she has promised her husband that she will never put him in the nursing home and that she will take care of him at home till he passes away. She is 87 years old and she is tired and she is trying to get help at home to help her with taking care of him.  She has a daughter who lives in the state.     Assessment & Plan:   Principal Problem:   COVID-19 Active Problems:   Fever   Acute metabolic encephalopathy   Type 2 diabetes mellitus with hypoglycemia without coma (HCC)   Dementia (HCC)   Acute encephalopathy   Pressure injury of skin   Feeding difficulties   Weakness   Palliative care by specialist   Goals of care, counseling/discussion   Hypomagnesemia   Hypophosphatemia   Hypokalemia   Dysphagia  1 COVID-pneumonia -Status post full course remdesivir. -Chest x-ray done initially did show some left basilar infiltrates patient was not on antibiotics as likely secondary to COVID-pneumonia. -Supportive  care.  2.  Fever -Patient noted to be spiking fevers with a temp of 101 this morning.  -Patient minimally responsive to noxious stimuli with ongoing acute encephalopathy despite treatment of COVID-19.   -Blood cultures ordered and pending with no growth to date.   -Repeat chest x-ray done with new bilateral pleural effusions, decreased aeration of both lung bases may represent pneumonia and/or atelectasis. -Urine Legionella antigen negative.  Urine pneumococcus antigen negative.   -Repeat urinalysis nitrite negative, leukocytes negative, 0-5 WBCs.   -Patient minimally responsive, with ongoing acute encephalopathy despite treatment of COVID-pneumonia. -Continue empiric IV vancomycin, IV cefepime. -If blood cultures remain negative tomorrow we will discontinue IV vancomycin. -If patient with continued fevers we will add IV Flagyl and will likely need further imaging/CT abdomen and pelvis for further evaluation. -Supportive care.  3.  Acute metabolic encephalopathy -Initially felt likely secondary to COVID-pneumonia in the setting of dementia.  Also concern for infectious etiology as patient spiking fevers. -Patient not improving clinically.   -CT head done on admission negative. -Family concerned for acute CVA and requested MRI brain which was done which is negative for any acute abnormalities  -Ammonia level within normal limits.   -Patient pancultured and results pending.   -Patient in current state unable to tolerate LP and likely risks of LP at this time  outweigh benefits.   -Continue empiric IV vancomycin IV cefepime.   -If blood cultures remain negative tomorrow we will discontinue IV vancomycin.   -Supportive care.    4.  Hypokalemia/hypomagnesemia/hypophosphatemia -Likely secondary to poor oral intake. -Potassium at 3.9, phosphorus at 1.7.   -K-Phos 30 mmol IV x1.   -Repeat labs in the morning.    5.  Diabetes mellitus type 2 -Patient with poor oral intake. -Patient started on  tube feeds at family's insistence and patient pulled out NG tube with tube and hand on 02/25/2021. -NG tube placed back in at family's insistence -Hemoglobin A1c 7.3 (02/21/2021) -CBG 219 this morning.   -Continue to hold glimepiride.   -Continue Lantus, SSI.    6.  History of dementia -Patient with history of dementia, has been declining, lives at home with his wife who is 61 years old and they have been married for 38 years. -Per Dr. Zigmund  in discussion with wife patient at baseline able to ambulate in the house, feed himself, use the restroom until he was recently admitted during this hospitalization. -Patient with poor oral intake, NG tube placed however patient pulled out NG tube and NG tube has been replaced and tube feeds started again per wife's request. -Palliative care consulted however seems as if family is resistant to hospice at this time or palliation and wanting to take patient home with home health. -Palliative care spoke with patient's daughter on 02/27/2021 and decision made to continue current mode of care for the next 48 hours also and reassess and if no meaningful/significant recovery of stabilization may need to consider transitioning to comfort measures. -Follow.  7.  Goals of care -Patient is DNR, patient with dementia with poor oral intake recently.  Small bore NG tube placed 02/23/2021 for nutrition which was supposed to be a temporary trial while patient was n.p.o. as he is high aspiration risk. -Patient pulled out NG tube. -Monitor over the next 24 hours. -If no significant improvement will need to discuss with wife regarding reassessment of hospice versus palliation. -It is noted per Dr. Zigmund  that wife does not want to hear palliative or hospice at this time and very adamant that patient will not go to a nursing home but will go home when she can take care of him in his final days. -Patient has been declining, spoke with wife again who feels further work-up needed  in terms of her MRI for further evaluation and at this time not ready to transition to full comfort measures. -Wife however is open to home with hospice if patient was to continue decline with no significant improvement. -Wife is open to palliative care following while in-house. -Palliative care spoke with patient's daughter on 02/27/2021 and decision made to continue current mode of care for the next 48 hours also and reassess and if no meaningful/significant recovery of stabilization may need to consider transitioning to comfort measures  8.  High risk aspiration/dysphagia -NG tube was placed on 02/23/2021 for feeding however patient pulled out NG tube 02/25/2021.   -NG tube replaced 02/27/2021.   -Tube feeds resumed.   -Follow.  9.  Pressure injury, POA Pressure Injury 02/21/21 Buttocks Left Stage 2 -  Partial thickness loss of dermis presenting as a shallow open injury with a red, pink wound bed without slough. (Active)  02/21/21 1700  Location: Buttocks  Location Orientation: Left  Staging: Stage 2 -  Partial thickness loss of dermis presenting as a shallow open injury with a red, pink wound  bed without slough.  Wound Description (Comments):   Present on Admission: Yes         DVT prophylaxis: Lovenox Code Status: DNR Family Communication: Updated wife on the telephone. Disposition:   Status is: Inpatient  Remains inpatient appropriate because:Persistent severe electrolyte disturbances  Dispo: The patient is from: Home              Anticipated d/c is to: Home              Patient currently is not medically stable to d/c.   Difficult to place patient No       Consultants:  Palliative care: Dr. Domingo Cocking 02/23/2021  Procedures:  CT head 02/19/2021 Chest x-ray 02/19/2021, 02/26/2021, 02/26/2021 Abdominal films 02/23/2021 MRI head 02/26/2021  Antimicrobials:  IV remdesivir 02/21/2021>>>>> 02/25/2021 IV vancomycin 02/26/2021>>>>> IV cefepime  02/26/2021>>>>>    Subjective: Minimally responsive to noxious stimuli.  No significant change over the past 48 hours.    Objective: Vitals:   02/27/21 2119 02/28/21 0318 02/28/21 0343 02/28/21 1000  BP: (!) 145/87 111/62  127/87  Pulse: 82 (!) 110 93 96  Resp: (!) 25 (!) 25  20  Temp: 98.7 F (37.1 C) 99.2 F (37.3 C)  98.9 F (37.2 C)  TempSrc: Oral Oral  Oral  SpO2: 97% 96%  92%  Weight:      Height:        Intake/Output Summary (Last 24 hours) at 02/28/2021 1138 Last data filed at 02/28/2021 1010 Gross per 24 hour  Intake 10796.23 ml  Output 1100 ml  Net 9696.23 ml    Filed Weights   02/25/21 0355 02/26/21 0435 02/27/21 0512  Weight: 67.2 kg 65.4 kg 67.3 kg    Examination:  General exam: Frail.  Minimally responsive to noxious stimuli.  Panda tube in nares. Respiratory system: CTA B anterior lung fields..  No wheezes, no crackles, no rhonchi.   Cardiovascular system: Regular rate rhythm no murmurs rubs or gallops.  No JVD.  No lower extremity edema.  Gastrointestinal system: Abdomen is soft, nontender, nondistended, positive bowel sounds.  No rebound.  No guarding.  Central nervous system: Minimally responsive to noxious stimuli.  Moving extremities spontaneously.  Extremities: Symmetric 5 x 5 power. Skin: No rashes, lesions or ulcers Psychiatry: Judgement and insight appear poor. Mood & affect unable to assess.    Data Reviewed: I have personally reviewed following labs and imaging studies  CBC: Recent Labs  Lab 02/22/21 0332 02/23/21 0319 02/24/21 0521 02/26/21 0414 02/27/21 0331  WBC 5.0 6.4 8.9 7.0 8.3  NEUTROABS 3.6 5.0 7.2  --  6.9  HGB 12.4* 12.8* 12.4* 11.7* 11.3*  HCT 36.8* 39.0 36.8* 35.1* 33.5*  MCV 93.9 94.4 93.4 93.1 93.1  PLT 168 164 156 167 196     Basic Metabolic Panel: Recent Labs  Lab 02/23/21 0319 02/23/21 1626 02/24/21 0521 02/24/21 1628 02/25/21 0337 02/25/21 0340 02/26/21 0414 02/27/21 0331 02/28/21 0324  NA 134*   --  133*  --   --  132* 136 137 134*  K 4.1  --  3.2*  --   --  3.3* 3.9 3.3* 3.9  CL 101  --  100  --   --  100 102 106 105  CO2 26  --  23  --   --  '25 26 24 23  '$ GLUCOSE 118*  --  215*  --   --  277* 160* 133* 223*  BUN 11  --  16  --   --  $'15 15 17 17  'k$ CREATININE 0.59*  --  0.60*  --   --  0.56* 0.56* 0.52* 0.46*  CALCIUM 7.9*  --  7.9*  --   --  7.6* 7.7* 7.6* 7.5*  MG 1.8  --  1.6*  --   --  2.0 1.8 2.1  --   PHOS 2.7   < > 2.4* 1.2* 1.4*  --  2.1* 2.0* 1.7*   < > = values in this interval not displayed.     GFR: Estimated Creatinine Clearance: 60.8 mL/min (A) (by C-G formula based on SCr of 0.46 mg/dL (L)).  Liver Function Tests: Recent Labs  Lab 02/22/21 0332 02/23/21 0319 02/24/21 0521 02/26/21 0414 02/27/21 0331  AST '29 31 26  '$ --   --   ALT '21 23 23  '$ --   --   ALKPHOS 54 52 45  --   --   BILITOT 0.7 0.8 1.0  --   --   PROT 6.2* 6.1* 5.6*  --   --   ALBUMIN 3.3* 3.1* 2.8* 2.5* 2.4*     CBG: Recent Labs  Lab 02/27/21 1118 02/27/21 1727 02/27/21 1947 02/27/21 2353 02/28/21 0523  GLUCAP 129* 140* 171* 230* 219*      Recent Results (from the past 240 hour(s))  Urine Culture     Status: Abnormal   Collection Time: 02/19/21  9:45 PM   Specimen: In/Out Cath Urine  Result Value Ref Range Status   Specimen Description   Final    IN/OUT CATH URINE Performed at [facility], [place] [address] [place], [place] [address]    Special Requests   Final    NONE Performed at [facility], [place] [address], [place], [place] [address]    Culture MULTIPLE SPECIES PRESENT, SUGGEST RECOLLECTION (A)  Final   Report Status 02/21/2021 FINAL  Final  Resp Panel by RT-PCR (Flu A&B, Covid) Nasopharyngeal Swab     Status: Abnormal   Collection Time: 02/19/21  9:46 PM   Specimen: Nasopharyngeal Swab; Nasopharyngeal(NP) swabs in vial transport medium  Result Value Ref Range Status   SARS Coronavirus 2 by RT PCR POSITIVE (A) NEGATIVE Final     Comment: RESULT CALLED TO, READ BACK BY AND VERIFIED WITH: Marina Gravel @ [phone] ON 03/03/2021 C VARNER (NOTE) SARS-CoV-2 target nucleic acids are DETECTED.  The SARS-CoV-2 RNA is generally detectable in upper respiratory specimens during the acute phase of infection. Positive results are indicative of the presence of the identified virus, but do not rule out bacterial infection or co-infection with other pathogens not detected by the test. Clinical correlation with patient history and other diagnostic information is necessary to determine patient infection status. The expected result is Negative.  Fact Sheet for Patients: EntrepreneurPulse.com.au  Fact Sheet for Healthcare Providers: IncredibleEmployment.be  This test is not yet approved or cleared by the [place] FDA and  has been authorized for detection and/or diagnosis of SARS-CoV-2 by FDA under an Emergency Use Authorization (EUA).  This EUA will remain in effect (meaning this te st can be used) for the duration of  the COVID-19 declaration under Section 564(b)(1) of the Act, 21 U.S.C. section 360bbb-3(b)(1), unless the authorization is terminated or revoked sooner.     Influenza A by PCR NEGATIVE NEGATIVE Final   Influenza B by PCR NEGATIVE NEGATIVE Final    Comment: (NOTE) The Xpert Xpress SARS-CoV-2/FLU/RSV plus assay is intended as an aid in the diagnosis of influenza from Nasopharyngeal  swab specimens and should not be used as a sole basis for treatment. Nasal washings and aspirates are unacceptable for Xpert Xpress SARS-CoV-2/FLU/RSV testing.  Fact Sheet for Patients: EntrepreneurPulse.com.au  Fact Sheet for Healthcare Providers: IncredibleEmployment.be  This test is not yet approved or cleared by the [place] FDA and has been authorized for detection and/or diagnosis of SARS-CoV-2 by FDA under an Emergency Use Authorization (EUA).  This EUA will remain in effect (meaning this test can be used) for the duration of the COVID-19 declaration under Section 564(b)(1) of the Act, 21 U.S.C. section 360bbb-3(b)(1), unless the authorization is terminated or revoked.  Performed at [facility], [place] [address] [place], [place] [address]   Blood Culture (routine x 2)     Status: None   Collection Time: 02/19/21  9:55 PM   Specimen: BLOOD  Result Value Ref Range Status   Specimen Description   Final    BLOOD BLOOD RIGHT FOREARM Performed at [place] [address], [place], [place] [address]    Special Requests   Final    BOTTLES DRAWN AEROBIC AND ANAEROBIC Blood Culture adequate volume Performed at [place] [address] [place], [place] [address]    Culture   Final    NO GROWTH 5 DAYS Performed at [facility], [place] [address] [place], [place] [address]    Report Status 02/25/2021 FINAL  Final  Blood Culture (routine x 2)     Status: None   Collection Time: 02/19/21 10:10 PM   Specimen: BLOOD  Result Value Ref Range Status   Specimen Description   Final    BLOOD RIGHT ANTECUBITAL Performed at [place] [address], [place], [place] [address]    Special Requests   Final    BOTTLES DRAWN AEROBIC AND ANAEROBIC Blood Culture adequate volume Performed at [place] [address],  [place], [place] [address]    Culture   Final    NO GROWTH 5 DAYS Performed at [facility], [place] [address], [place], [place] [address]    Report Status 02/25/2021 FINAL  Final  MRSA Next Gen by PCR, Nasal     Status: None   Collection Time: 02/26/21 12:27 PM   Specimen: Nasal Mucosa; Nasal Swab  Result Value Ref Range Status   MRSA by PCR Next Gen NOT DETECTED NOT DETECTED Final    Comment: (NOTE) The GeneXpert MRSA Assay (FDA approved for NASAL specimens only), is one component of a comprehensive MRSA  colonization surveillance program. It is not intended to diagnose MRSA infection nor to guide or monitor treatment for MRSA infections. Test performance is not FDA approved in patients less than 12 years old. Performed at [facility], [place] [address], [place], [place] [address]   Culture, blood (Routine X 2) w Reflex to ID Panel     Status: None (Preliminary result)   Collection Time: 02/26/21 12:32 PM   Specimen: BLOOD  Result Value Ref Range Status   Specimen Description   Final    BLOOD LEFT HAND Performed at [place] [address], [place], [place] [address]    Special Requests   Final    BOTTLES DRAWN AEROBIC AND ANAEROBIC Blood Culture adequate volume Performed at [place] [address] [place], [place] [address]    Culture   Final    NO GROWTH 2 DAYS Performed at [facility], [address] [place], [place] [address]    Report Status PENDING  Incomplete  Culture, blood (Routine X 2) w Reflex to ID Panel     Status: None (Preliminary result)   Collection Time: 02/26/21 12:33 PM   Specimen: BLOOD  Result Value Ref Range Status   Specimen Description   Final    BLOOD RIGHT HAND Performed at [place] [address] [place], [place] [address]    Special Requests   Final    BOTTLES DRAWN AEROBIC AND ANAEROBIC Blood Culture adequate volume Performed at [place] [address] [place], [place] [address]    Culture   Final    NO GROWTH 2 DAYS Performed at [place] [address], [place], [place] [address]    Report Status PENDING  Incomplete  Urine Culture     Status: None   Collection Time: 02/26/21 12:53 PM   Specimen: Urine, Catheterized  Result Value Ref Range Status   Specimen Description   Final    URINE, CATHETERIZED Performed at [place] [address], [place], [place] [address]    Special Requests    Final    NONE Performed at [facility], [place] [address], [place], [place] [address]    Culture   Final    NO GROWTH Performed at [facility], [place] [address], [place],  [address]    Report Status 02/28/2021 FINAL  Final          Radiology Studies: MR BRAIN WO CONTRAST  Result Date: 02/27/2021 CLINICAL DATA:  Transient ischemic attack EXAM: MRI HEAD WITHOUT CONTRAST TECHNIQUE: Multiplanar, multiecho pulse sequences of the brain and surrounding structures were obtained without intravenous contrast. COMPARISON:  09/01/2019 FINDINGS: Brain: No acute infarct, mass effect or extra-axial collection. No acute or chronic hemorrhage. There is multifocal hyperintense T2-weighted signal within the white matter. Diffuse, severe atrophy. The midline structures are normal. Vascular: Major flow voids are preserved. Skull and upper cervical spine: Normal calvarium and skull base. Visualized upper cervical spine and soft tissues are normal. Sinuses/Orbits:No paranasal sinus fluid levels or advanced mucosal thickening. No mastoid or middle ear effusion. Normal orbits. IMPRESSION: 1. No acute intracranial abnormality. 2. Diffuse, severe atrophy and chronic small vessel disease. Electronically Signed   By: Ulyses Jarred M.D.   On: 02/27/2021 00:21   DG Abd Portable 1V  Result Date: 02/27/2021 CLINICAL DATA:  G-tube placement EXAM: PORTABLE ABDOMEN - 1 VIEW COMPARISON:  [facility], 02/24/2021.  Chest radiograph, 02/26/2021. FINDINGS: Enteric feeding tube, with guidewire in place, and tip projecting to the LEFT of midline within stomach. Nonobstructive bowel gas pattern. No interval osseous abnormality. IMPRESSION: Gastric placement of enteric feeding tube. Consider advancement if transpyloric positioning is desired. Electronically Signed   By: Michaelle Birks M.D.   On: 02/27/2021 16:26        Scheduled Meds:  chlorhexidine  15 mL Mouth Rinse BID   enoxaparin (LOVENOX) injection  40 mg  Subcutaneous Q24H   feeding supplement (PROSource TF)  45 mL Per Tube BID   free water  100 mL Per Tube Q4H   insulin aspart  0-6 Units Subcutaneous Q6H   insulin glargine-yfgn  14 Units Subcutaneous QHS   mouth rinse  15 mL Mouth Rinse q12n4p   Continuous Infusions:  sodium chloride 100 mL/hr at 02/28/21 0258   ceFEPime (MAXIPIME) IV     feeding supplement (OSMOLITE 1.2 CAL) 1,000 mL (  02/28/21 1010)   potassium PHOSPHATE IVPB (in mmol) 30 mmol (02/28/21 1130)   vancomycin 1,250 mg (02/27/21 1514)     LOS: 7 days    Time spent: 35 minutes    Irine Seal, MD [facility]   To contact the attending provider between 7A-7P or the covering provider during after hours 7P-7A, please log into the web site [email or web address] and access using universal [place] password for that web site. If you do not have the password, please call the hospital operator.  02/28/2021, 11:38 AM

## 2021-02-28 NOTE — Progress Notes (Signed)
Pharmacy Antibiotic Note  Stanley Gilbert is a 87 y.o. male admitted on 03/09/2021 with  fever, fatigue, found to be COVID positive .  Pharmacy has been consulted for vancomycin and cefepime dosing.   Repeat CXR this AM w/ new bilateral pleural effusions, also w/ pneumonia vs atelectasis. Intermittently spiking fevers (this AM Tm 101.5)  Today, 02/28/2021: D3 abx Borderline temps; no true fevers today, but has been spiking regular fevers up until today WBC WNL, although trending up SCr low but stable S/p appropriate COVID treatment, completed 8/17  Plan: Continue Vancomycin '1250mg'$  IV q24 hours (eAUC 497, Scr round to 0.8, TBW for CrCl/Ke since TBW<IBW, Vd 0.7 L/kg) Vancomycin levels as appropriate SCr q48 hr while on vancomycin Increase Cefepime to 2g IV q8 hours (CrCl borderline for q8 dosing)   Height: '5\' 11"'$  (180.3 cm) Weight: 67.3 kg (148 lb 5.9 oz) IBW/kg (Calculated) : 75.3  Temp (24hrs), Avg:99.1 F (37.3 C), Min:97.7 F (36.5 C), Max:100.7 F (38.2 C)  Recent Labs  Lab 02/22/21 0332 02/23/21 0319 02/24/21 0521 02/25/21 0340 02/26/21 0414 02/27/21 0331 02/28/21 0324  WBC 5.0 6.4 8.9  --  7.0 8.3  --   CREATININE 0.59* 0.59* 0.60* 0.56* 0.56* 0.52* 0.46*     Estimated Creatinine Clearance: 60.8 mL/min (A) (by C-G formula based on SCr of 0.46 mg/dL (L)).    Allergies  Allergen Reactions   Sitagliptin     Other reaction(s): ABDOMINAL PAIN    Antimicrobials this admission: Vancomycin 8/18 >>  Cefepime 8/18 >>  Remdesivir 8/13 >> 8/17  Dose adjustments this admission: 8/20 incr cefepime 2g q12 >> q8 w/ borderline CrCl  Microbiology results: 8/11 BCx: NGF 8/18 Bcx: ngtd 8/11 UCx: mx spp. 8/18 Ucx: NGF 8/18 MRSA PCR: neg 8/18 strep pneumo Ag: neg 8/18 Legionella: neg   Thank you for allowing pharmacy to be a part of this patient's care.  Reuel Boom, PharmD, BCPS [phone] 02/28/2021, 10:01 AM

## 2021-03-01 DIAGNOSIS — G9341 Metabolic encephalopathy: Secondary | ICD-10-CM | POA: Diagnosis not present

## 2021-03-01 DIAGNOSIS — F0390 Unspecified dementia without behavioral disturbance: Secondary | ICD-10-CM | POA: Diagnosis not present

## 2021-03-01 DIAGNOSIS — Z7189 Other specified counseling: Secondary | ICD-10-CM | POA: Diagnosis not present

## 2021-03-01 DIAGNOSIS — Z515 Encounter for palliative care: Secondary | ICD-10-CM | POA: Diagnosis not present

## 2021-03-01 DIAGNOSIS — R1310 Dysphagia, unspecified: Secondary | ICD-10-CM | POA: Diagnosis not present

## 2021-03-01 DIAGNOSIS — U071 COVID-19: Secondary | ICD-10-CM | POA: Diagnosis not present

## 2021-03-01 LAB — CBC WITH DIFFERENTIAL/PLATELET
Abs Immature Granulocytes: 0.11 10*3/uL — ABNORMAL HIGH (ref 0.00–0.07)
Basophils Absolute: 0 10*3/uL (ref 0.0–0.1)
Basophils Relative: 0 %
Eosinophils Absolute: 0 10*3/uL (ref 0.0–0.5)
Eosinophils Relative: 0 %
HCT: 31.8 % — ABNORMAL LOW (ref 39.0–52.0)
Hemoglobin: 10.3 g/dL — ABNORMAL LOW (ref 13.0–17.0)
Immature Granulocytes: 2 %
Lymphocytes Relative: 13 %
Lymphs Abs: 0.9 10*3/uL (ref 0.7–4.0)
MCH: 31.2 pg (ref 26.0–34.0)
MCHC: 32.4 g/dL (ref 30.0–36.0)
MCV: 96.4 fL (ref 80.0–100.0)
Monocytes Absolute: 0.9 10*3/uL (ref 0.1–1.0)
Monocytes Relative: 13 %
Neutro Abs: 4.9 10*3/uL (ref 1.7–7.7)
Neutrophils Relative %: 72 %
Platelets: 238 10*3/uL (ref 150–400)
RBC: 3.3 MIL/uL — ABNORMAL LOW (ref 4.22–5.81)
RDW: 13.7 % (ref 11.5–15.5)
WBC: 6.9 10*3/uL (ref 4.0–10.5)
nRBC: 0 % (ref 0.0–0.2)

## 2021-03-01 LAB — PHOSPHORUS: Phosphorus: 2.2 mg/dL — ABNORMAL LOW (ref 2.5–4.6)

## 2021-03-01 LAB — GLUCOSE, CAPILLARY
Glucose-Capillary: 270 mg/dL — ABNORMAL HIGH (ref 70–99)
Glucose-Capillary: 276 mg/dL — ABNORMAL HIGH (ref 70–99)
Glucose-Capillary: 321 mg/dL — ABNORMAL HIGH (ref 70–99)

## 2021-03-01 LAB — BASIC METABOLIC PANEL
Anion gap: 5 (ref 5–15)
BUN: 17 mg/dL (ref 8–23)
CO2: 24 mmol/L (ref 22–32)
Calcium: 7.6 mg/dL — ABNORMAL LOW (ref 8.9–10.3)
Chloride: 103 mmol/L (ref 98–111)
Creatinine, Ser: 0.52 mg/dL — ABNORMAL LOW (ref 0.61–1.24)
GFR, Estimated: >60 mL/min (ref >60)
Glucose, Bld: 304 mg/dL — ABNORMAL HIGH (ref 70–99)
Potassium: 4.3 mmol/L (ref 3.5–5.1)
Sodium: 132 mmol/L — ABNORMAL LOW (ref 135–145)

## 2021-03-01 MED ORDER — GLUCERNA 1.2 CAL PO LIQD
1000.0000 mL | ORAL | Status: DC
  Filled 2021-03-01: qty 1000

## 2021-03-01 MED ORDER — INSULIN GLARGINE-YFGN 100 UNIT/ML ~~LOC~~ SOLN
18.0000 [IU] | Freq: Every day | SUBCUTANEOUS | Status: DC
  Administered 2021-03-01: 18 [IU] via SUBCUTANEOUS
  Filled 2021-03-01: qty 0.18

## 2021-03-01 MED ORDER — OSMOLITE 1.2 CAL PO LIQD
1000.0000 mL | ORAL | Status: DC
  Administered 2021-03-01: 1000 mL

## 2021-03-01 MED ORDER — FREE WATER
200.0000 mL | Status: DC
  Administered 2021-03-01 – 2021-03-04 (×15): 200 mL

## 2021-03-01 MED ORDER — MORPHINE SULFATE (PF) 2 MG/ML IV SOLN
2.0000 mg | Freq: Once | INTRAVENOUS | Status: AC
  Administered 2021-03-01: 2 mg via INTRAVENOUS
  Filled 2021-03-01: qty 1

## 2021-03-01 MED ORDER — POTASSIUM PHOSPHATES 15 MMOLE/5ML IV SOLN
30.0000 mmol | Freq: Once | INTRAVENOUS | Status: AC
  Administered 2021-03-01: 30 mmol via INTRAVENOUS
  Filled 2021-03-01: qty 10

## 2021-03-01 MED ORDER — METOPROLOL TARTRATE 5 MG/5ML IV SOLN
5.0000 mg | Freq: Three times a day (TID) | INTRAVENOUS | Status: DC
  Administered 2021-03-01 – 2021-03-03 (×8): 5 mg via INTRAVENOUS
  Filled 2021-03-01 (×8): qty 5

## 2021-03-01 MED ORDER — GLYCOPYRROLATE 0.2 MG/ML IJ SOLN
0.1000 mg | Freq: Four times a day (QID) | INTRAMUSCULAR | Status: AC
  Administered 2021-03-01 – 2021-03-03 (×8): 0.1 mg via INTRAVENOUS
  Filled 2021-03-01 (×9): qty 1

## 2021-03-01 MED ORDER — GLUCERNA 1.5 CAL PO LIQD
1000.0000 mL | ORAL | Status: DC
  Administered 2021-03-01 – 2021-03-03 (×3): 1000 mL
  Filled 2021-03-01 (×4): qty 1000

## 2021-03-01 MED ORDER — FREE WATER
110.0000 mL | Status: DC
  Administered 2021-03-01: 110 mL

## 2021-03-01 MED ORDER — MORPHINE SULFATE (PF) 2 MG/ML IV SOLN
1.0000 mg | INTRAVENOUS | Status: DC | PRN
  Administered 2021-03-01 – 2021-03-03 (×7): 1 mg via INTRAVENOUS
  Filled 2021-03-01 (×7): qty 1

## 2021-03-01 NOTE — Progress Notes (Signed)
Daily Progress Note   Patient Name: Stanley Gilbert       Date: 03/01/2021 DOB: November 08, 1930  Age: 87 y.o. MRN#: [id number] Attending Physician: Eugenie Filler, MD Primary Care Physician: [name], MD Admit Date: 03/11/2021  Reason for Consultation/Follow-up: Establishing goals of care  Subjective: Patient is resting in bed.  Appears overall unchanged, wife not at bedside this am.    Length of Stay: 8  Current Medications: Scheduled Meds:   chlorhexidine  15 mL Mouth Rinse BID   enoxaparin (LOVENOX) injection  40 mg Subcutaneous Q24H   free water  200 mL Per Tube Q4H   glycopyrrolate  0.1 mg Intravenous Q6H   insulin aspart  0-6 Units Subcutaneous Q6H   insulin glargine-yfgn  18 Units Subcutaneous QHS   mouth rinse  15 mL Mouth Rinse q12n4p   metoprolol tartrate  5 mg Intravenous Q8H    Continuous Infusions:  sodium chloride 100 mL/hr at 03/01/21 1246   ceFEPime (MAXIPIME) IV 2 g (03/01/21 0902)   feeding supplement (GLUCERNA 1.5 CAL) 1,000 mL (03/01/21 1528)   feeding supplement (OSMOLITE 1.2 CAL)     vancomycin 1,250 mg (03/01/21 1248)    PRN Meds: acetaminophen, acetaminophen, docusate **AND** sennosides, glycopyrrolate, guaiFENesin-dextromethorphan, hydrALAZINE, lip balm, morphine injection, ondansetron **OR** ondansetron (ZOFRAN) IV  Physical Exam         Appears frail chronically ill Shallow breath sounds  Does not verbalize, does not follow commands 1-2 + both lower extremities edema   Vital Signs: BP 139/69   Pulse 84   Temp 99.3 F (37.4 C) (Oral)   Resp 20   Ht '5\' 11"'$  (1.803 m)   Wt 67.3 kg   SpO2 94%   BMI 20.69 kg/m  SpO2: SpO2: 94 % O2 Device: O2 Device: Room Air O2 Flow Rate:    Intake/output summary:  Intake/Output Summary (Last 24  hours) at 03/01/2021 1609 Last data filed at 03/01/2021 1600 Gross per 24 hour  Intake 3696.59 ml  Output 925 ml  Net 2771.59 ml    LBM: Last BM Date: 02/25/21 Baseline Weight: Weight: 63.6 kg Most recent weight: Weight: 67.3 kg Palliative performance scale 30%.      Palliative Assessment/Data:    Flowsheet Rows    Flowsheet Row Most Recent Value  Intake Tab   Referral  Department Hospitalist  Unit at Time of Referral Med/Surg Unit  Palliative Care Primary Diagnosis Sepsis/Infectious Disease  Date Notified 02/22/21  Reason for referral Clarify Goals of Care  Date of Admission 02/16/2021  Date first seen by Palliative Care 02/23/21  # of days Palliative referral response time 1 Day(s)  # of days IP prior to Palliative referral 2  Clinical Assessment   Palliative Performance Scale Score 10%  Psychosocial & Spiritual Assessment   Palliative Care Outcomes   Patient/Family meeting held? Yes  Who was at the meeting? Wife       Patient Active Problem List   Diagnosis Date Noted   Acute metabolic encephalopathy [id number]   Fever 02/26/2021   Hypomagnesemia    Hypophosphatemia    Hypokalemia    Dysphagia    Feeding difficulties    Weakness    Palliative care by specialist    Goals of care, counseling/discussion    Pressure injury of skin 02/23/2021   COVID-19 02/21/2021   Dementia ([place])    Acute encephalopathy    Type 2 diabetes mellitus with hypoglycemia without coma ([place]) 07/27/2013   Psoriasis and similar disorder 10/23/2012    Palliative Care Assessment & Plan   Patient Profile:    Assessment: 87 year old gentleman who lives at home with his wife with a history of dementia admitted with generalized weakness anorexia increasing confusion and fever.  Patient admitted to hospital medicine service for COVID-pneumonia, electrolyte abnormalities and hospital course complicated by ongoing dysphagia.  Patient now on tube feeds via NG tube.  Palliative services  following for goals of care discussions.  Recommendations/Plan: MRI noted to have no acute intracranial abnormality and diffuse, severe atrophy and chronic small vessel disease. Remains on broad spectrum antibiotics Trial of tube feeds is ongoing.  Continue to monitor hospital course.   Call placed and discussed with wife: she asks me if we ever figures out where his infection was. I reviewed the chart with her, discussed that patient is on antibiotics for COVID PNA. She asks me if his pneumonia will get better.   We discussed about the patient's overall condition in the context of his current illness. Discussed frankly but compassionately with her that he remains at risk for ongoing decline and decompensation. She re iterates that she will not place him in a nursing home and wishes to continue current mode of care and will remain hopeful that he will be able to come home towards the end of this hospitalization.   PMT to follow.       Goals of Care and Additional Recommendations: Limitations on Scope of Treatment: Full Scope Treatment  Code Status:    Code Status Orders  (From admission, onward)           Start     Ordered   02/21/21 0142  Do not attempt resuscitation (DNR)  Continuous       Question Answer Comment  In the event of cardiac or respiratory ARREST Do not call a "code blue"   In the event of cardiac or respiratory ARREST Do not perform Intubation, CPR, defibrillation or ACLS   In the event of cardiac or respiratory ARREST Use medication by any route, position, wound care, and other measures to relive pain and suffering. May use oxygen, suction and manual treatment of airway obstruction as needed for comfort.      02/21/21 0143           Code Status History     This patient  has a current code status but no historical code status.       Prognosis: Guarded  Discharge Planning: To be determined.    Care plan was discussed with wife on the phone.    Thank you for allowing the Palliative Medicine Team to assist in the care of this patient.   Time In: 1400 Time Out: 1425 Total Time 25 Prolonged Time Billed  no       Greater than 50%  of this time was spent counseling and coordinating care related to the above assessment and plan.  [name], MD  Please contact Palliative Medicine Team phone at [phone] for questions and concerns.

## 2021-03-01 NOTE — Progress Notes (Addendum)
Nutrition Follow-up  DOCUMENTATION CODES:   Not applicable  INTERVENTION:   -D/c Osmolite 1.2  -Initiate Glucerna 1.5 @ 50 ml/hr via NGT   200 ml free water flush every 4 hours  Tube feeding regimen provides 1800 kcal (100% of needs), 99 grams of protein, and 910 ml of H2O. Total free water: 2110 ml daily  NUTRITION DIAGNOSIS:   Increased nutrient needs related to acute illness, catabolic illness ([id number] infection) as evidenced by estimated needs.  Ongoing  GOAL:   Patient will meet greater than or equal to 90% of their needs  Progressing   MONITOR:   Weight trends, Labs, Skin  REASON FOR ASSESSMENT:   Consult Assessment of nutrition requirement/status, Enteral/tube feeding initiation and management  ASSESSMENT:   87 y.o. male with medical history of dementia (a/o to person and place at baseline) and type 2 DM. He presented to the ED due to severe fatigue, general weakness, anorexia, increased confusion, and fevers. Patient lives with his wife who prepares his meals but he is normally able to feed himself, ambulate unassisted, and use the bathroom on his own. He became more confused 2 days PTA. He was found to be COVID-19 positive in the ED. Patient's wife has been trying to arrange home health and was encouraged to consider having him placed in a nursing facility, but is adamant that she will not do that.  8/15- NGT placed, TF initiated 8/17- NGT pulled out 8/18- s/p BSE- recommend NPO status 8/19- NGT replaced, TF re-started  Reviewed I/O's: +12.5 L x 24 hours and +8.7 L since admission  UOP: 700 ml x 24 hours  TF has been resumed. Osmolite 1.2 currently infusing at 50 ml/hr via NGT. Noted persistent hyperglycemia.   Case discussed with pharmacist; Glucerna 1.2 is not available. Will change to Glucerna 1.5.  Palliative care following pt; daughter agrees to time limited trial with plans to transition to comfort care if no meaningful improvement.   Labs  reviewed: Na: 132, Phos: 2.2, K WDL, CBGS: [id number] (inpatient orders for glycemic control are 0-6 units insulin aspart every 4 hours and 18 units insulin glargine daily at bedtime).    Diet Order:   Diet Order             Diet NPO time specified  Diet effective now                   EDUCATION NEEDS:   No education needs have been identified at this time  Skin:  Skin Assessment: Skin Integrity Issues: Skin Integrity Issues:: Stage II Stage II: L buttocks  Last BM:  02/23/21  Height:   Ht Readings from Last 1 Encounters:  02/23/21 '5\' 11"'$  (1.803 m)    Weight:   Wt Readings from Last 1 Encounters:  03/01/21 67.3 kg    Ideal Body Weight:  78.2 kg  BMI:  Body mass index is 20.69 kg/m.  Estimated Nutritional Needs:   Kcal:  1800-2000 kcal  Protein:  90-105 grams  Fluid:  >/= 2.2 L/day    [name], RD, LDN, CDCES Registered Dietitian II Certified Diabetes Care and Education Specialist Please refer to [facility] for RD and/or RD on-call/weekend/after hours pager

## 2021-03-01 NOTE — Significant Event (Signed)
Rapid Response Event Note   Reason for Call :  Elevated MEWS score into red, audible gurgling and secretions, and RN needs clarification about how to progress care. Per RN report and documentation found in chart, Wife reportedly wants "everything done" and daughter wants comfort care. No copies of advanced directive found in chart.   Initial Focused Assessment:  Patient not responding verbally, but seems more agitated during assessment. Vital signs as expected. Likewise, patient appears to be in pain as evidenced by moaning out, grimacing, muscular tension, and tachypnea. Bilateral lung sounds with slight rhonchi, but not as severe per RN Bonnita Nasuti reported prior to administration of Robinul. No abnormal heart sounds. No mottling seen. Good pulses in all four extremities. NG tube patent in left nares, infusing tube feed, and positioned out of reach. Urinary output via external catheter is amber and frothy.    Interventions:  Assessed patient and reviewed chart. Advised RN Bonnita Nasuti that Robinul given earlier appears to have decreased secretions. Recommended seeking orders for pain management and ask on call provider to update family for goals of care clarification. Orders given for single dose of Morphine '2mg'$  IV per Jeannette Corpus, NP.  Plan of Care:  Monitor condition. Jeannette Corpus, NP advised RN that she will call family if condition worsens, but will hold off for now.    Event Summary:   MD Notified: X. Blount, NP notified by RN at [phone] to [place] and @ [place] Call Time: [place] Time: 0100 End Time: [place], RN

## 2021-03-01 NOTE — Progress Notes (Signed)
PROGRESS NOTE    Stanley Gilbert  [id number] DOB: November 07, 1930 DOA: 02/15/2021 PCP: Stanley Cruel, MD    Chief Complaint  Patient presents with   Fever   Fatigue   Covid Positive    Brief Narrative:   87 year old male with history of dementia type 2 diabetes admitted with generalized weakness anorexia increased confusion and fever.  Patient lives with his wife for 59 years at home.  Per his wife prior to admission he was able to ambulate in the house, feed himself and use the bathroom on his own.   He was found to be COVID-positive.  Head CT shows no acute findings.  Patient has been having decreased p.o. intake for 2 days prior to admission.  She is trying to get home health at home so he can she can take him home.  She wants him to eat again.  He was seen by speech therapy and he is a high aspiration risk and has recommended NPO.  Wife requesting to have PEG tube placed for nutrition so she can take him home.  I have consulted palliative care.  Wife reports that she has promised her husband that she will never put him in the nursing home and that she will take care of him at home till he passes away. She is 87 years old and she is tired and she is trying to get help at home to help her with taking care of him.  She has a daughter who lives in the state.     Assessment & Plan:   Principal Problem:   COVID-19 Active Problems:   Fever   Acute metabolic encephalopathy   Type 2 diabetes mellitus with hypoglycemia without coma (HCC)   Dementia (HCC)   Acute encephalopathy   Pressure injury of skin   Feeding difficulties   Weakness   Palliative care by specialist   Goals of care, counseling/discussion   Hypomagnesemia   Hypophosphatemia   Hypokalemia   Dysphagia  1 COVID-pneumonia -Status post full course remdesivir. -Chest x-ray done initially did show some left basilar infiltrates patient was not on antibiotics at that time as it was felt findings consistent with  COVID-pneumonia.   -Supportive care.  2.  Fever -Patient noted to be spiking fevers with no clear etiology.   -Patient minimally responsive to noxious stimuli with ongoing acute encephalopathy despite treatment of COVID-19.   -Blood cultures ordered and pending with no growth to date.   -Repeat chest x-ray done with new bilateral pleural effusions, decreased aeration of both lung bases may represent pneumonia and/or atelectasis. -Urine Legionella antigen negative.  Urine pneumococcus antigen negative.   -Repeat urinalysis nitrite negative, leukocytes negative, 0-5 WBCs.   -Patient minimally responsive, with ongoing acute encephalopathy despite treatment of COVID-pneumonia. -Fever curve trending down. -Continue IV cefepime. -Discontinue IV vancomycin after today's doses. -If patient with continued fevers we will add IV Flagyl and will likely need further imaging/CT abdomen and pelvis for further evaluation. -Supportive care.  3.  Acute metabolic encephalopathy -Initially felt likely secondary to COVID-pneumonia in the setting of dementia.  Also concern for infectious etiology as patient was spiking fevers which have been trending down. -Patient not improving clinically.   -CT head done on admission negative. -Family concerned for acute CVA and requested MRI brain which was done which is negative for any acute abnormalities  -Ammonia level within normal limits.   -Patient pancultured and results pending.   -Patient in current state unable to tolerate LP  and likely risks of LP at this time outweigh benefits.   -Continue IV cefepime.   -Discontinue IV vancomycin after today's dose.   -Supportive care.  4.  Hypokalemia/hypomagnesemia/hypophosphatemia -Likely secondary to poor oral intake. -Potassium at 4.3, phosphorus at 2.2  -K-Phos 30 mmol IV x1.   -Repeat labs in the morning.   5.  Diabetes mellitus type 2 -Patient with poor oral intake. -Patient started on tube feeds at family's  insistence and patient pulled out NG tube on 02/25/2021. -NG tube placed back in at family's insistence -Hemoglobin A1c 7.3 (02/21/2021) -CBG 276 this morning.   -Glimepiride on hold.   -CBG 219 this morning.   -Increase Semglee insulin to 18 units daily.   -SSI.    6.  History of dementia -Patient with history of dementia, has been declining, lives at home with his wife who is 39 years old and they have been married for 53 years. -Per Dr. Zigmund  in discussion with wife patient at baseline able to ambulate in the house, feed himself, use the restroom until he was recently admitted during this hospitalization. -Patient with poor oral intake, NG tube placed however patient pulled out NG tube and NG tube has been replaced and tube feeds started again per wife's request. -Palliative care consulted however seems as if family is resistant to hospice at this time or palliation and wanting to take patient home with home health. -Palliative care spoke with patient's daughter on 02/27/2021 and decision made to continue current mode of care for the next 48 hours also and reassess and if no meaningful/significant recovery of stabilization may need to consider transitioning to comfort measures. -Follow.  7.  Goals of care -Patient is DNR, patient with dementia with poor oral intake recently.  Small bore NG tube placed 02/23/2021 for nutrition which was supposed to be a temporary trial while patient was n.p.o. as he is high aspiration risk. -Patient pulled out NG tube. -Monitor over the next 24 hours. -If no significant improvement will need to discuss with wife regarding reassessment of hospice versus palliation. -It is noted per Dr. Zigmund  that wife does not want to hear palliative or hospice at this time and very adamant that patient will not go to a nursing home but will go home when she can take care of him in his final days. -Patient has been declining, spoke with wife again who feels further work-up  needed in terms of her MRI for further evaluation and at this time not ready to transition to full comfort measures. -Wife however is open to home with hospice if patient was to continue decline with no significant improvement. -Wife is open to palliative care following while in-house. -Palliative care spoke with patient's daughter on 02/27/2021 and decision made to continue current mode of care for the next 48 hours and reassess and if no meaningful/significant recovery of stabilization may need to consider transitioning to comfort measures  8.  High risk aspiration/dysphagia -NG tube was placed on 02/23/2021 for feeding however patient pulled out NG tube 02/25/2021.   -NG tube replaced 02/27/2021.   -Tube feeds resumed.   -Follow.  9.  Pressure injury, POA Pressure Injury 02/21/21 Buttocks Left Stage 2 -  Partial thickness loss of dermis presenting as a shallow open injury with a red, pink wound bed without slough. (Active)  02/21/21 1700  Location: Buttocks  Location Orientation: Left  Staging: Stage 2 -  Partial thickness loss of dermis presenting as a shallow open injury with a  red, pink wound bed without slough.  Wound Description (Comments):   Present on Admission: Yes   #10 tachycardia -Patient noted to be tachycardic overnight and noted to be tachycardic this morning with heart rate sustaining in the 140s to 160s.  RN. -Patient noted to have some congestion/gurgling noted. -Placed on scheduled Robinul x48 hours. -Placed on scheduled IV Lopressor. -Continue empiric IV antibiotics.      DVT prophylaxis: Lovenox Code Status: DNR Family Communication: No family at bedside. Disposition:   Status is: Inpatient  Remains inpatient appropriate because:Persistent severe electrolyte disturbances  Dispo: The patient is from: Home              Anticipated d/c is to: Home per wife.              Patient currently is not medically stable to d/c.   Difficult to place patient  No       Consultants:  Palliative care: Dr. Domingo Cocking 02/23/2021  Procedures:  CT head 02/19/2021 Chest x-ray 02/19/2021, 02/15/2021, 02/26/2021 Abdominal films 02/23/2021 MRI head 02/26/2021  Antimicrobials:  IV remdesivir 02/21/2021>>>>> 02/25/2021 IV vancomycin 02/26/2021>>>>> 03/01/2021 IV cefepime 02/26/2021>>>>>    Subjective: Minimally responsive. Sleeping deeply. Snoring.  Events overnight noted.  Objective: Vitals:   03/01/21 0429 03/01/21 0449 03/01/21 0858 03/01/21 0947  BP: 114/82  123/69 (!) 130/55  Pulse: (!) 129  (!) 149 (!) 104  Resp: 20     Temp: 98.9 F (37.2 C)  100 F (37.8 C)   TempSrc: Oral  Oral   SpO2: 97%  93%   Weight:  67.3 kg    Height:        Intake/Output Summary (Last 24 hours) at 03/01/2021 1102 Last data filed at 03/01/2021 0726 Gross per 24 hour  Intake 2917.48 ml  Output 825 ml  Net 2092.48 ml    Filed Weights   02/26/21 0435 02/27/21 0512 03/01/21 0449  Weight: 65.4 kg 67.3 kg 67.3 kg    Examination:  General exam: Frail.  Minimally responsive to noxious stimuli.  Respiratory system: Coarse BS anterior lung fields, congested sounding. Some gurgling.  Cardiovascular system: Tachycardia.  No JVD.  No lower extremity edema.  Gastrointestinal system: Abdomen is soft, nontender, nondistended, positive bowel sounds.  No rebound.  No guarding.   Central nervous system: Minimally responsive to noxious stimuli.  Moving extremities spontaneously.  Extremities: Symmetric 5 x 5 power. Skin: No rashes, lesions or ulcers Psychiatry: Judgement and insight appear poor. Mood & affect unable to assess.    Data Reviewed: I have personally reviewed following labs and imaging studies  CBC: Recent Labs  Lab 02/23/21 0319 02/24/21 0521 02/26/21 0414 02/27/21 0331 03/01/21 0313  WBC 6.4 8.9 7.0 8.3 6.9  NEUTROABS 5.0 7.2  --  6.9 4.9  HGB 12.8* 12.4* 11.7* 11.3* 10.3*  HCT 39.0 36.8* 35.1* 33.5* 31.8*  MCV 94.4 93.4 93.1 93.1 96.4  PLT 164  156 167 196 238     Basic Metabolic Panel: Recent Labs  Lab 02/23/21 0319 02/23/21 1626 02/24/21 0521 02/24/21 1628 02/25/21 0337 02/25/21 0340 02/26/21 0414 02/27/21 0331 02/28/21 0324 03/01/21 0313  NA 134*  --  133*  --   --  132* 136 137 134* 132*  K 4.1  --  3.2*  --   --  3.3* 3.9 3.3* 3.9 4.3  CL 101  --  100  --   --  100 102 106 105 103  CO2 26  --  23  --   --  $'25 26 24 23 24  'V$ GLUCOSE 118*  --  215*  --   --  277* 160* 133* 223* 304*  BUN 11  --  16  --   --  '15 15 17 17 17  '$ CREATININE 0.59*  --  0.60*  --   --  0.56* 0.56* 0.52* 0.46* 0.52*  CALCIUM 7.9*  --  7.9*  --   --  7.6* 7.7* 7.6* 7.5* 7.6*  MG 1.8  --  1.6*  --   --  2.0 1.8 2.1  --   --   PHOS 2.7   < > 2.4*   < > 1.4*  --  2.1* 2.0* 1.7* 2.2*   < > = values in this interval not displayed.     GFR: Estimated Creatinine Clearance: 60.8 mL/min (A) (by C-G formula based on SCr of 0.52 mg/dL (L)).  Liver Function Tests: Recent Labs  Lab 02/23/21 0319 02/24/21 0521 02/26/21 0414 02/27/21 0331  AST 31 26  --   --   ALT 23 23  --   --   ALKPHOS 52 45  --   --   BILITOT 0.8 1.0  --   --   PROT 6.1* 5.6*  --   --   ALBUMIN 3.1* 2.8* 2.5* 2.4*     CBG: Recent Labs  Lab 02/28/21 0523 02/28/21 1200 02/28/21 1648 03/01/21 0427 03/01/21 0811  GLUCAP 219* 273* 288* 276* 270*      Recent Results (from the past 240 hour(s))  Urine Culture     Status: Abnormal   Collection Time: 02/19/21  9:45 PM   Specimen: In/Out Cath Urine  Result Value Ref Range Status   Specimen Description   Final    IN/OUT CATH URINE Performed at [facility], [place] [address], [place], [place] [address]    Special Requests   Final    NONE Performed at [facility], [place] [address] [place], [place] [address]    Culture MULTIPLE SPECIES PRESENT, SUGGEST RECOLLECTION (A)  Final   Report Status 02/21/2021 FINAL  Final  Resp Panel by RT-PCR (Flu A&B, Covid) Nasopharyngeal Swab      Status: Abnormal   Collection Time: 02/19/21  9:46 PM   Specimen: Nasopharyngeal Swab; Nasopharyngeal(NP) swabs in vial transport medium  Result Value Ref Range Status   SARS Coronavirus 2 by RT PCR POSITIVE (A) NEGATIVE Final    Comment: RESULT CALLED TO, READ BACK BY AND VERIFIED WITH: Marina Gravel @ [phone] ON 02/27/2021 C VARNER (NOTE) SARS-CoV-2 target nucleic acids are DETECTED.  The SARS-CoV-2 RNA is generally detectable in upper respiratory specimens during the acute phase of infection. Positive results are indicative of the presence of the identified virus, but do not rule out bacterial infection or co-infection with other pathogens not detected by the test. Clinical correlation with patient history and other diagnostic information is necessary to determine patient infection status. The expected result is Negative.  Fact Sheet for Patients: EntrepreneurPulse.com.au  Fact Sheet for Healthcare Providers: IncredibleEmployment.be  This test is not yet approved or cleared by the [place] FDA and  has been authorized for detection and/or diagnosis of SARS-CoV-2 by FDA under an Emergency Use Authorization (EUA).  This EUA will remain in effect (meaning this te st can be used) for the duration of  the COVID-19 declaration under Section 564(b)(1) of the Act, 21 U.S.C. section 360bbb-3(b)(1), unless the authorization is terminated or revoked sooner.     Influenza  A by PCR NEGATIVE NEGATIVE Final   Influenza B by PCR NEGATIVE NEGATIVE Final    Comment: (NOTE) The Xpert Xpress SARS-CoV-2/FLU/RSV plus assay is intended as an aid in the diagnosis of influenza from Nasopharyngeal swab specimens and should not be used as a sole basis for treatment. Nasal washings and aspirates are unacceptable for Xpert Xpress SARS-CoV-2/FLU/RSV testing.  Fact Sheet for Patients: EntrepreneurPulse.com.au  Fact Sheet for Healthcare  Providers: IncredibleEmployment.be  This test is not yet approved or cleared by the [place] FDA and has been authorized for detection and/or diagnosis of SARS-CoV-2 by FDA under an Emergency Use Authorization (EUA). This EUA will remain in effect (meaning this test can be used) for the duration of the COVID-19 declaration under Section 564(b)(1) of the Act, 21 U.S.C. section 360bbb-3(b)(1), unless the authorization is terminated or revoked.  Performed at [facility], [place] [address], [place], [place] [address]   Blood Culture (routine x 2)     Status: None   Collection Time: 02/19/21  9:55 PM   Specimen: BLOOD  Result Value Ref Range Status   Specimen Description   Final    BLOOD BLOOD RIGHT FOREARM Performed at [facility] [address], [place], [place] [address]    Special Requests   Final    BOTTLES DRAWN AEROBIC AND ANAEROBIC Blood Culture adequate volume Performed at [facility] [address] [place], [place] [address]    Culture   Final    NO GROWTH 5 DAYS Performed at [facility], [place] [address] [place], [place] [address]    Report Status 02/25/2021 FINAL  Final  Blood Culture (routine x 2)     Status: None   Collection Time: 02/19/21 10:10 PM   Specimen: BLOOD  Result Value Ref Range Status   Specimen Description   Final    BLOOD RIGHT ANTECUBITAL Performed at [facility] [address] [place], [place] [address]    Special Requests   Final    BOTTLES DRAWN AEROBIC AND ANAEROBIC Blood Culture adequate volume Performed at [facility] [address] [place], [place] [address]    Culture   Final    NO GROWTH 5 DAYS Performed at [facility], [place] [address] [place], [place] [address]    Report Status 02/25/2021 FINAL  Final  MRSA Next Gen by PCR, Nasal     Status: None   Collection Time: 02/26/21 12:27 PM   Specimen:  Nasal Mucosa; Nasal Swab  Result Value Ref Range Status   MRSA by PCR Next Gen NOT DETECTED NOT DETECTED Final    Comment: (NOTE) The GeneXpert MRSA Assay (FDA approved for NASAL specimens only), is one component of a comprehensive MRSA colonization surveillance program. It is not intended to diagnose MRSA infection nor to guide or monitor treatment for MRSA infections. Test performance is not FDA approved in patients less than 57 years old. Performed at [facility], [place] [address], [place], [place] [address]   Culture, blood (Routine X 2) w Reflex to ID Panel     Status: None (Preliminary result)   Collection Time: 02/26/21 12:32 PM   Specimen: BLOOD  Result Value Ref Range Status   Specimen Description   Final    BLOOD LEFT HAND Performed at [facility] [address] [place], [place] [address]    Special Requests   Final    BOTTLES DRAWN AEROBIC  AND ANAEROBIC Blood Culture adequate volume Performed at [facility] [address], [place], [place] [address]    Culture   Final    NO GROWTH 2 DAYS Performed at [facility] [address] [place], [place] [address]    Report Status PENDING  Incomplete  Culture, blood (Routine X 2) w Reflex to ID Panel     Status: None (Preliminary result)   Collection Time: 02/26/21 12:33 PM   Specimen: BLOOD  Result Value Ref Range Status   Specimen Description   Final    BLOOD RIGHT HAND Performed at [facility] [address], [place], [place] [address]    Special Requests   Final    BOTTLES DRAWN AEROBIC AND ANAEROBIC Blood Culture adequate volume Performed at [facility] [address], [place], [place] [address]    Culture   Final    NO GROWTH 2 DAYS Performed at [facility] [address] [place], [place] [address]    Report Status PENDING  Incomplete  Urine Culture     Status: None   Collection Time: 02/26/21  12:53 PM   Specimen: Urine, Catheterized  Result Value Ref Range Status   Specimen Description   Final    URINE, CATHETERIZED Performed at [facility] [address], [place], [place] [address]    Special Requests   Final    NONE Performed at [facility], [place] [address], [place], [place] [address]    Culture   Final    NO GROWTH Performed at [facility], [place] [address], [place], [place] [address]    Report Status 02/28/2021 FINAL  Final          Radiology Studies: DG Abd Portable 1V  Result Date: 02/27/2021 CLINICAL DATA:  G-tube placement EXAM: PORTABLE ABDOMEN - 1 VIEW COMPARISON:  K[facility], 02/24/2021.  Chest radiograph, 02/26/2021. FINDINGS: Enteric feeding tube, with guidewire in place, and tip projecting to the LEFT of midline within stomach. Nonobstructive bowel gas pattern. No interval osseous abnormality. IMPRESSION: Gastric placement of enteric feeding tube. Consider advancement if transpyloric positioning is desired. Electronically Signed   By: Michaelle Birks M.D.   On: 02/27/2021 16:26        Scheduled Meds:  chlorhexidine  15 mL Mouth Rinse BID   enoxaparin (LOVENOX) injection  40 mg Subcutaneous Q24H   feeding supplement (PROSource TF)  45 mL Per Tube BID   free water  100 mL Per Tube Q4H   glycopyrrolate  0.1 mg Intravenous Q6H   insulin aspart  0-6 Units Subcutaneous Q6H   insulin glargine-yfgn  18 Units Subcutaneous QHS   mouth rinse  15 mL Mouth Rinse q12n4p   metoprolol tartrate  5 mg Intravenous Q8H   Continuous Infusions:  sodium chloride 100 mL/hr at 03/01/21 0300   ceFEPime (MAXIPIME) IV 2 g (03/01/21 0902)   feeding supplement (OSMOLITE 1.2 CAL) 50 mL/hr at 02/28/21 1819   potassium PHOSPHATE IVPB (in mmol) 30 mmol (03/01/21 0851)   vancomycin 1,250 mg (02/28/21 1325)     LOS: 8 days    Time spent: 40 minutes    Irine Seal, MD [facility]   To contact the attending provider  between 7A-7P or the covering provider during after hours 7P-7A, please log into the web site [email or web address] and access using universal [place] password for that web site. If you do not have the password, please call  the hospital operator.  03/01/2021, 11:02 AM

## 2021-03-01 NOTE — Plan of Care (Signed)
Plan of care reviewed with pt and wife. Pt responsive to painful stimuli (withdraws from painful stimuli / no eye opening) and unable to answer any orientation questions. Pt tachycardic at beginning of shift 140-160 MD paged to make aware - see orders for metoprolol IV. Pt BP stable. HR responded well to metop and HR 80's-100's throughout rest of shift. Pt on RA, lung sounds diminished with rhonchi and crackles throughout. Pt congested non-productive weak cough and audible secretions. Unable to clear or suction secretions.  Pt remains NPO on TF via NGT with free water flushes. Pt incontinent of bowel and bladder. Condom catheter/ primofit used. Adequate urine output this shift. No BM this shift - MD made aware. Pt turned and repositioned at least Q2 hours and pillows used to elevate heels.  BG monitored Q6 hours and treated with sliding scale insulin. Pt safety maintained   Problem: Education: Goal: Knowledge of General Education information will improve Description: Including pain rating scale, medication(s)/side effects and non-pharmacologic comfort measures Outcome: Not Progressing   Problem: Health Behavior/Discharge Planning: Goal: Ability to manage health-related needs will improve Outcome: Not Progressing   Problem: Activity: Goal: Risk for activity intolerance will decrease Outcome: Not Progressing   Problem: Nutrition: Goal: Adequate nutrition will be maintained Outcome: Not Progressing   Problem: Safety: Goal: Ability to remain free from injury will improve Outcome: Not Progressing   Problem: Skin Integrity: Goal: Risk for impaired skin integrity will decrease Outcome: Not Progressing   Problem: Education: Goal: Knowledge of risk factors and measures for prevention of condition will improve Outcome: Not Progressing   Problem: Coping: Goal: Psychosocial and spiritual needs will be supported Outcome: Not Progressing   Problem: Respiratory: Goal: Will maintain a patent  airway Outcome: Not Progressing Goal: Complications related to the disease process, condition or treatment will be avoided or minimized Outcome: Not Progressing  Pt safety maintained.

## 2021-03-02 ENCOUNTER — Inpatient Hospital Stay (HOSPITAL_COMMUNITY): Payer: Medicare Other

## 2021-03-02 ENCOUNTER — Encounter (HOSPITAL_COMMUNITY): Payer: Self-pay | Admitting: Family Medicine

## 2021-03-02 DIAGNOSIS — U071 COVID-19: Secondary | ICD-10-CM | POA: Diagnosis not present

## 2021-03-02 DIAGNOSIS — Z515 Encounter for palliative care: Secondary | ICD-10-CM | POA: Diagnosis not present

## 2021-03-02 DIAGNOSIS — Z7189 Other specified counseling: Secondary | ICD-10-CM | POA: Diagnosis not present

## 2021-03-02 DIAGNOSIS — R1310 Dysphagia, unspecified: Secondary | ICD-10-CM | POA: Diagnosis not present

## 2021-03-02 DIAGNOSIS — F0390 Unspecified dementia without behavioral disturbance: Secondary | ICD-10-CM | POA: Diagnosis not present

## 2021-03-02 DIAGNOSIS — G9341 Metabolic encephalopathy: Secondary | ICD-10-CM | POA: Diagnosis not present

## 2021-03-02 LAB — GLUCOSE, CAPILLARY
Glucose-Capillary: 292 mg/dL — ABNORMAL HIGH (ref 70–99)
Glucose-Capillary: 277 mg/dL — ABNORMAL HIGH (ref 70–99)
Glucose-Capillary: 310 mg/dL — ABNORMAL HIGH (ref 70–99)
Glucose-Capillary: 351 mg/dL — ABNORMAL HIGH (ref 70–99)
Glucose-Capillary: 329 mg/dL — ABNORMAL HIGH (ref 70–99)
Glucose-Capillary: 234 mg/dL — ABNORMAL HIGH (ref 70–99)

## 2021-03-02 LAB — RENAL FUNCTION PANEL
Albumin: 2 g/dL — ABNORMAL LOW (ref 3.5–5.0)
Anion gap: 5 (ref 5–15)
BUN: 16 mg/dL (ref 8–23)
CO2: 24 mmol/L (ref 22–32)
Calcium: 7.6 mg/dL — ABNORMAL LOW (ref 8.9–10.3)
Chloride: 102 mmol/L (ref 98–111)
Creatinine, Ser: 0.54 mg/dL — ABNORMAL LOW (ref 0.61–1.24)
GFR, Estimated: >60 mL/min (ref >60)
Glucose, Bld: 300 mg/dL — ABNORMAL HIGH (ref 70–99)
Phosphorus: 2.3 mg/dL — ABNORMAL LOW (ref 2.5–4.6)
Potassium: 4 mmol/L (ref 3.5–5.1)
Sodium: 131 mmol/L — ABNORMAL LOW (ref 135–145)

## 2021-03-02 LAB — CBC WITH DIFFERENTIAL/PLATELET
Abs Immature Granulocytes: 0.23 10*3/uL — ABNORMAL HIGH (ref 0.00–0.07)
Basophils Absolute: 0 10*3/uL (ref 0.0–0.1)
Basophils Relative: 0 %
Eosinophils Absolute: 0.1 10*3/uL (ref 0.0–0.5)
Eosinophils Relative: 1 %
HCT: 31.2 % — ABNORMAL LOW (ref 39.0–52.0)
Hemoglobin: 10.2 g/dL — ABNORMAL LOW (ref 13.0–17.0)
Immature Granulocytes: 2 %
Lymphocytes Relative: 12 %
Lymphs Abs: 1.1 10*3/uL (ref 0.7–4.0)
MCH: 31 pg (ref 26.0–34.0)
MCHC: 32.7 g/dL (ref 30.0–36.0)
MCV: 94.8 fL (ref 80.0–100.0)
Monocytes Absolute: 1 10*3/uL (ref 0.1–1.0)
Monocytes Relative: 10 %
Neutro Abs: 7.3 10*3/uL (ref 1.7–7.7)
Neutrophils Relative %: 75 %
Platelets: 255 10*3/uL (ref 150–400)
RBC: 3.29 MIL/uL — ABNORMAL LOW (ref 4.22–5.81)
RDW: 13.6 % (ref 11.5–15.5)
WBC: 9.7 10*3/uL (ref 4.0–10.5)
nRBC: 0 % (ref 0.0–0.2)

## 2021-03-02 LAB — MAGNESIUM: Magnesium: 1.6 mg/dL — ABNORMAL LOW (ref 1.7–2.4)

## 2021-03-02 MED ORDER — IOHEXOL 9 MG/ML PO SOLN: 500.0000 mL | ORAL | Status: DC

## 2021-03-02 MED ORDER — FUROSEMIDE 10 MG/ML IJ SOLN
20.0000 mg | Freq: Once | INTRAMUSCULAR | Status: AC
  Administered 2021-03-02: 20 mg via INTRAVENOUS
  Filled 2021-03-02: qty 2

## 2021-03-02 MED ORDER — INSULIN ASPART 100 UNIT/ML IJ SOLN
0.0000 [IU] | Freq: Three times a day (TID) | INTRAMUSCULAR | Status: DC
  Administered 2021-03-02: 11 [IU] via SUBCUTANEOUS
  Administered 2021-03-02: 5 [IU] via SUBCUTANEOUS

## 2021-03-02 MED ORDER — IOHEXOL 350 MG/ML SOLN
100.0000 mL | Freq: Once | INTRAVENOUS | Status: AC | PRN
  Administered 2021-03-02: 80 mL via INTRAVENOUS

## 2021-03-02 MED ORDER — INSULIN GLARGINE-YFGN 100 UNIT/ML ~~LOC~~ SOLN
24.0000 [IU] | Freq: Every day | SUBCUTANEOUS | Status: DC
  Administered 2021-03-02 – 2021-03-03 (×2): 24 [IU] via SUBCUTANEOUS
  Filled 2021-03-02 (×2): qty 0.24

## 2021-03-02 MED ORDER — BISACODYL 10 MG RE SUPP
10.0000 mg | Freq: Every day | RECTAL | Status: DC | PRN
  Administered 2021-03-02: 10 mg via RECTAL
  Filled 2021-03-02: qty 1

## 2021-03-02 MED ORDER — IOHEXOL 9 MG/ML PO SOLN
ORAL | Status: AC
  Filled 2021-03-02: qty 1000

## 2021-03-02 MED ORDER — MAGNESIUM SULFATE 4 GM/100ML IV SOLN
4.0000 g | Freq: Once | INTRAVENOUS | Status: AC
  Administered 2021-03-02: 4 g via INTRAVENOUS
  Filled 2021-03-02: qty 100

## 2021-03-02 MED ORDER — SODIUM PHOSPHATES 45 MMOLE/15ML IV SOLN
20.0000 mmol | Freq: Once | INTRAVENOUS | Status: AC
  Administered 2021-03-02: 20 mmol via INTRAVENOUS
  Filled 2021-03-02: qty 6.67

## 2021-03-02 MED ORDER — VANCOMYCIN HCL 1500 MG/300ML IV SOLN
1500.0000 mg | INTRAVENOUS | Status: DC
  Administered 2021-03-02 – 2021-03-03 (×2): 1500 mg via INTRAVENOUS
  Filled 2021-03-02 (×2): qty 300

## 2021-03-02 MED ORDER — ALBUMIN HUMAN 25 % IV SOLN
25.0000 g | Freq: Once | INTRAVENOUS | Status: AC
  Administered 2021-03-02: 25 g via INTRAVENOUS
  Filled 2021-03-02: qty 100

## 2021-03-02 NOTE — Progress Notes (Signed)
Daily Progress Note   Patient Name: Stanley Gilbert       Date: 03/02/2021 DOB: 26-Jul-1930  Age: 87 y.o. MRN#: [id number] Attending Physician: Eugenie Filler, MD Primary Care Physician: [name], MD Admit Date: 02/22/2021  Reason for Consultation/Follow-up: Establishing goals of care  Subjective: Patient is resting in bed.  Appears overall unchanged, wife not at bedside currently.    Length of Stay: 9  Current Medications: Scheduled Meds:   chlorhexidine  15 mL Mouth Rinse BID   enoxaparin (LOVENOX) injection  40 mg Subcutaneous Q24H   free water  200 mL Per Tube Q4H   glycopyrrolate  0.1 mg Intravenous Q6H   insulin aspart  0-15 Units Subcutaneous TID WC   insulin glargine-yfgn  24 Units Subcutaneous QHS   mouth rinse  15 mL Mouth Rinse q12n4p   metoprolol tartrate  5 mg Intravenous Q8H    Continuous Infusions:  sodium chloride 100 mL/hr at 03/01/21 1246   ceFEPime (MAXIPIME) IV 2 g (03/02/21 0928)   feeding supplement (GLUCERNA 1.5 CAL) 1,000 mL (03/02/21 1415)   sodium phosphate  Dextrose 5% IVPB 20 mmol (03/02/21 1414)   vancomycin 1,500 mg (03/02/21 1203)    PRN Meds: acetaminophen, acetaminophen, bisacodyl, docusate **AND** sennosides, glycopyrrolate, guaiFENesin-dextromethorphan, hydrALAZINE, lip balm, morphine injection, ondansetron **OR** ondansetron (ZOFRAN) IV  Physical Exam         Appears frail chronically ill Shallow breath sounds  Does not verbalize, does not follow commands 1-2 + both lower extremities edema Has NGT  Vital Signs: BP (!) 142/70 (BP Location: Right Arm)   Pulse 95   Temp 97.7 F (36.5 C)   Resp (!) 24   Ht '5\' 11"'$  (1.803 m)   Wt 76.6 kg   SpO2 90%   BMI 23.55 kg/m  SpO2: SpO2: 90 % O2 Device: O2 Device: Room Air O2 Flow  Rate:    Intake/output summary:  Intake/Output Summary (Last 24 hours) at 03/02/2021 1427 Last data filed at 03/02/2021 0800 Gross per 24 hour  Intake 5449.16 ml  Output 1500 ml  Net 3949.16 ml    LBM: Last BM Date: 02/25/21 Baseline Weight: Weight: 63.6 kg Most recent weight: Weight: 76.6 kg Palliative performance scale 30%.      Palliative Assessment/Data:    Flowsheet Rows    Flowsheet  Row Most Recent Value  Intake Tab   Referral Department Hospitalist  Unit at Time of Referral Med/Surg Unit  Palliative Care Primary Diagnosis Sepsis/Infectious Disease  Date Notified 02/22/21  Reason for referral Clarify Goals of Care  Date of Admission 03/11/2021  Date first seen by Palliative Care 02/23/21  # of days Palliative referral response time 1 Day(s)  # of days IP prior to Palliative referral 2  Clinical Assessment   Palliative Performance Scale Score 10%  Psychosocial & Spiritual Assessment   Palliative Care Outcomes   Patient/Family meeting held? Yes  Who was at the meeting? Wife       Patient Active Problem List   Diagnosis Date Noted   Acute metabolic encephalopathy [id number]   Fever 02/26/2021   Hypomagnesemia    Hypophosphatemia    Hypokalemia    Dysphagia    Feeding difficulties    Weakness    Palliative care by specialist    Goals of care, counseling/discussion    Pressure injury of skin 02/23/2021   COVID-19 02/21/2021   Dementia ([place])    Acute encephalopathy    Type 2 diabetes mellitus with hypoglycemia without coma ([place]) 07/27/2013   Psoriasis and similar disorder 10/23/2012    Palliative Care Assessment & Plan   Patient Profile:    Assessment: 87 year old gentleman who lives at home with his wife with a history of dementia admitted with generalized weakness anorexia increasing confusion and fever.  Patient admitted to hospital medicine service for COVID-pneumonia, electrolyte abnormalities and hospital course complicated by ongoing dysphagia.   Patient now on tube feeds via NG tube.  Palliative services following for goals of care discussions.  Recommendations/Plan: MRI noted to have no acute intracranial abnormality and diffuse, severe atrophy and chronic small vessel disease. Remains on broad spectrum antibiotics Trial of tube feeds is ongoing.  Continue to monitor hospital course.   A time trial of current therapies is ongoing. PMT to follow.      Goals of Care and Additional Recommendations: Limitations on Scope of Treatment: Full Scope Treatment  Code Status:    Code Status Orders  (From admission, onward)           Start     Ordered   02/21/21 0142  Do not attempt resuscitation (DNR)  Continuous       Question Answer Comment  In the event of cardiac or respiratory ARREST Do not call a "code blue"   In the event of cardiac or respiratory ARREST Do not perform Intubation, CPR, defibrillation or ACLS   In the event of cardiac or respiratory ARREST Use medication by any route, position, wound care, and other measures to relive pain and suffering. May use oxygen, suction and manual treatment of airway obstruction as needed for comfort.      02/21/21 0143           Code Status History     This patient has a current code status but no historical code status.       Prognosis: Guarded  Discharge Planning: To be determined.    Care plan was discussed with IDT  Thank you for allowing the Palliative Medicine Team to assist in the care of this patient.   Time In: 1300 Time Out: 1315 Total Time 15 Prolonged Time Billed  no       Greater than 50%  of this time was spent counseling and coordinating care related to the above assessment and plan.  [name], MD  Please  contact Palliative Medicine Team phone at [phone] for questions and concerns.

## 2021-03-02 NOTE — Care Management Important Message (Signed)
Important Message  Patient Details IM Letter given to the Patient. Name: AADHVIK GRADY MRN: [id number] Date of Birth: 1930-12-10   Medicare Important Message Given:  Yes     Kerin Salen 03/02/2021, 1:23 PM

## 2021-03-02 NOTE — Progress Notes (Addendum)
Pharmacy Antibiotic Note  Stanley Gilbert is a 87 y.o. male admitted on 03/08/2021 with  fever, fatigue, found to be COVID positive .  Pharmacy has been consulted for vancomycin and cefepime dosing.   Repeat CXR this AM w/ new bilateral pleural effusions, also w/ pneumonia vs atelectasis. Intermittently spiking fevers. Vancomycin d/c'd after 8/21 dose.   Today, 03/02/2021: D5 abx Temp up to 102.5 last night, orders to restart vancomycin today Noted weight increase of ~20 lbs from yesterday (67>76 kg).  RN rechecked weight and was 76.5kg (of note, pt now receiving continuous tube feeds).  Per chart review, patient baseline weight appears around 160lb, so today's weight is likely accurate.   WBC WNL SCr low but stable S/p appropriate COVID treatment, completed 8/17  Plan: Restart vancomycin at '1500mg'$  IV q24 hours (eAUC 440, Scr round to 0.8, TBW for CrCl/Ke since TBW<IBW, Vd 0.7 L/kg) Vancomycin levels as appropriate SCr q48 hr while on vancomycin Continue cefepime 2g IV q8 hours   Height: '5\' 11"'$  (180.3 cm) Weight: 76.6 kg (168 lb 14 oz) IBW/kg (Calculated) : 75.3  Temp (24hrs), Avg:100 F (37.8 C), Min:99.1 F (37.3 C), Max:102.5 F (39.2 C)  Recent Labs  Lab 02/24/21 0521 02/25/21 0340 02/26/21 0414 02/27/21 0331 02/28/21 0324 03/01/21 0313 03/02/21 0317  WBC 8.9  --  7.0 8.3  --  6.9 9.7  CREATININE 0.60*   < > 0.56* 0.52* 0.46* 0.52* 0.54*   < > = values in this interval not displayed.     Estimated Creatinine Clearance: 68 mL/min (A) (by C-G formula based on SCr of 0.54 mg/dL (L)).    Allergies  Allergen Reactions   Sitagliptin     Other reaction(s): ABDOMINAL PAIN    Antimicrobials this admission: Vancomycin 8/18 >>  Cefepime 8/18 >>  Remdesivir 8/13 >> 8/17  Dose adjustments this admission: 8/20 incr cefepime 2g q12 >> q8 w/ borderline CrCl 8/22 incr vancomycin 1250 mg q24h >> '1500mg'$  q24h w/ new weight  Microbiology results: 8/11 BCx: NGF 8/18 Bcx:  ngtd 8/11 UCx: mx spp. 8/18 Ucx: NGF 8/18 MRSA PCR: neg 8/18 strep pneumo Ag: neg 8/18 Legionella: neg   Thank you for allowing pharmacy to be a part of this patient's care.  Dimple Nanas, PharmD 03/02/2021 9:37 AM

## 2021-03-02 NOTE — Progress Notes (Addendum)
PROGRESS NOTE    Stanley Gilbert  [id number] DOB: 01-Dec-1930 DOA: 02/26/2021 PCP: [name], MD    Chief Complaint  Patient presents with   Fever   Fatigue   Covid Positive    Brief Narrative:   87 year old male with history of dementia type 2 diabetes admitted with generalized weakness anorexia increased confusion and fever.  Patient lives with his wife for 59 years at home.  Per his wife prior to admission he was able to ambulate in the house, feed himself and use the bathroom on his own.   He was found to be COVID-positive.  Head CT shows no acute findings.  Patient has been having decreased p.o. intake for 2 days prior to admission.  She is trying to get home health at home so he can she can take him home.  She wants him to eat again.  He was seen by speech therapy and he is a high aspiration risk and has recommended NPO.  Wife requesting to have PEG tube placed for nutrition so she can take him home.  I have consulted palliative care.  Wife reports that she has promised her husband that she will never put him in the nursing home and that she will take care of him at home till he passes away. She is 87 years old and she is tired and she is trying to get help at home to help her with taking care of him.  She has a daughter who lives in the state.     Assessment & Plan:   Principal Problem:   COVID-19 Active Problems:   Fever   Acute metabolic encephalopathy   Type 2 diabetes mellitus with hypoglycemia without coma (HCC)   Dementia (HCC)   Acute encephalopathy   Pressure injury of skin   Feeding difficulties   Weakness   Palliative care by specialist   Goals of care, counseling/discussion   Hypomagnesemia   Hypophosphatemia   Hypokalemia   Dysphagia  1 COVID-pneumonia -Status post full course remdesivir. -Chest x-ray done initially did show some left basilar infiltrates patient was not on antibiotics at that time as it was felt findings consistent with  COVID-pneumonia.   -Supportive care.  2.  Fever -Patient noted to be spiking fevers with no clear etiology.   -Patient minimally responsive to noxious stimuli with ongoing acute encephalopathy despite treatment of COVID-19.   -Blood cultures ordered and pending with no growth to date.   -Repeat chest x-ray done with new bilateral pleural effusions, decreased aeration of both lung bases may represent pneumonia and/or atelectasis. -Urine Legionella antigen negative.  Urine pneumococcus antigen negative.   -Repeat urinalysis nitrite negative, leukocytes negative, 0-5 WBCs.   -Patient minimally responsive, with ongoing acute encephalopathy despite treatment of COVID-pneumonia. -Fever curve was trending down and subsequently spiked another fever of 102.5 overnight after IV vancomycin was discontinued.  -Check CT abdomen and pelvis.   -Continue IV cefepime.   -Resume IV Vancomycin.  -Continue IV cefepime. -Supportive care.  3.  Acute metabolic encephalopathy -Initially felt likely secondary to COVID-pneumonia in the setting of dementia.  Also concern for infectious etiology as patient was spiking fevers which were initially trending down after IV antibiotics were broadened.   -IV Vanco discontinued yesterday patient spiked a fever again of 102.5 at 2040 hrs. 03/01/2021.   -Patient not improving clinically.   -CT head done on admission negative. -Family concerned for acute CVA and requested MRI brain which was done which is negative  for any acute abnormalities  -Ammonia level within normal limits.   -Patient pancultured and results pending.   -Patient in current state unable to tolerate LP and likely risks of LP at this time outweigh benefits.   -CT abdomen and pelvis as patient with continued fevers. -Resume IV vancomycin. -Continue IV cefepime. -Supportive care.  4.  Hypokalemia/hypomagnesemia/hypophosphatemia -Likely secondary to poor oral intake. -Potassium at 4.0.  Magnesium" 1.6,  phosphorus at 2.3.   -Magnesium sulfate 4 g IV x1.   -K-Phos 30 mmol IV x1.   -Repeat labs in the morning.  5.  Diabetes mellitus type 2 -Patient with poor oral intake. -Patient started on tube feeds at family's insistence and patient pulled out NG tube on 02/25/2021. -NG tube placed back in at family's insistence -Hemoglobin A1c 7.3 (02/21/2021) -CBG 292 this morning.   -Glimepiride on hold.   -CBG 219 this morning.   -Increase Semglee insulin to 24 units daily.   -SSI.    6.  History of dementia -Patient with history of dementia, has been declining, lives at home with his wife who is 34 years old and they have been married for 75 years. -Per Dr. Zigmund  in discussion with wife patient at baseline able to ambulate in the house, feed himself, use the restroom until he was recently admitted during this hospitalization. -Patient with poor oral intake, NG tube placed however patient pulled out NG tube and NG tube has been replaced and tube feeds started again per wife's request. -Palliative care consulted however seems as if family is resistant to hospice at this time or palliation and wanting to take patient home with home health. -Palliative care spoke with patient's daughter on 02/27/2021 and decision made to continue current mode of care for the next 48 hours also and reassess and if no meaningful/significant recovery of stabilization may need to consider transitioning to comfort measures. -Follow.  7.  Goals of care -Patient is DNR, patient with dementia with poor oral intake recently.  Small bore NG tube placed 02/23/2021 for nutrition which was supposed to be a temporary trial while patient was n.p.o. as he is high aspiration risk. -Patient pulled out NG tube. -Monitor over the next 24 hours. -If no significant improvement will need to discuss with wife regarding reassessment of hospice versus palliation. -It is noted per Dr. Zigmund  that wife does not want to hear palliative or  hospice at this time and very adamant that patient will not go to a nursing home but will go home when she can take care of him in his final days. -Patient has been declining, spoke with wife again who feels further work-up needed in terms of her MRI for further evaluation and at this time not ready to transition to full comfort measures. -Wife however is open to home with hospice if patient was to continue decline with no significant improvement. -Wife is open to palliative care following while in-house. -Palliative care spoke with patient's daughter on 02/27/2021 and decision made to continue current mode of care for the next 48 hours and reassess and if no meaningful/significant recovery of stabilization may need to consider transitioning to comfort measures  8.  High risk aspiration/dysphagia -NG tube was placed on 02/23/2021 for feeding however patient pulled out NG tube 02/25/2021.   -NG tube replaced 02/27/2021 per family insistence.   -Tube feeds resumed.   -Follow.  9.  Pressure injury, POA Pressure Injury 02/21/21 Buttocks Left Stage 2 -  Partial thickness loss of dermis presenting  as a shallow open injury with a red, pink wound bed without slough. (Active)  02/21/21 1700  Location: Buttocks  Location Orientation: Left  Staging: Stage 2 -  Partial thickness loss of dermis presenting as a shallow open injury with a red, pink wound bed without slough.  Wound Description (Comments):   Present on Admission: Yes   #10 tachycardia -Patient noted to be tachycardic overnight (02/28/2021)and noted to be tachycardic the morning of 8/21 with heart rate sustaining in the 140s to 160s.  RN. -Patient noted to have some congestion/gurgling noted. -Tachycardia improved on current regimen of scheduled IV Lopressor and scheduled Robinul.   -Continue IV antibiotics.   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      DVT prophylaxis: Lovenox Code Status: DNR Family Communication: Updated wife on the phone. Updated daughter on phone. Disposition:   Status is: Inpatient  Remains inpatient appropriate because:Persistent severe electrolyte disturbances/acute metabolic encephalopathy/fever  Dispo: The patient is from: Home              Anticipated d/c is to: Home per wife.              Patient currently is not medically stable to d/c.   Difficult to place patient No       Consultants:  Palliative care: Dr. Domingo Cocking 02/23/2021  Procedures:  CT head 02/19/2021 Chest x-ray 02/19/2021, 02/21/2021, 02/26/2021 Abdominal films 02/23/2021 MRI head 02/26/2021 CT abdomen and pelvis pending 03/02/2021  Antimicrobials:  IV remdesivir 02/21/2021>>>>> 02/25/2021 IV vancomycin 02/26/2021>>>>> 03/01/2021 IV cefepime 02/26/2021>>>>> IV vancomycin 03/03/2021>>>    Subjective: Minimally responsive.  Opens eyes very briefly to noxious stimuli.    Objective: Vitals:   03/02/21 0500 03/02/21 0809 03/02/21 0815 03/02/21 1151  BP: 137/84 (!) 154/102 (!) 150/107 (!) 142/70  Pulse: 95 94  95  Resp:  (!) 30  (!) 24  Temp:  99.1 F (37.3 C)  97.7 F (36.5 C)  TempSrc:  Oral    SpO2:  94%  90%  Weight:      Height:        Intake/Output Summary (Last 24 hours) at 03/02/2021 1223 Last data filed at 03/02/2021 0800 Gross per 24 hour  Intake 5982.47 ml  Output 1500 ml  Net 4482.47 ml    Filed Weights   02/27/21 0512 03/01/21 0449 03/02/21 0500  Weight: 67.3 kg 67.3 kg 76.6 kg    Examination:  General exam: Minimally responsive to noxious stimuli.  Frail. Respiratory system: Scattered coarse breath sounds anterior lung fields.  Decreasing congestion. Cardiovascular system: RRR no murmurs rubs or gallops.  No JVD.  No lower extremity edema.  Gastrointestinal system: Abdomen is soft, nontender, nondistended, positive bowel sounds.  No rebound.  No guarding.   Central nervous system:  Minimally responsive to noxious stimuli.  Moving extremities spontaneously.  Extremities: Symmetric 5 x 5 power. Skin: No rashes, lesions or ulcers Psychiatry: Judgement and insight appear poor. Mood & affect unable to assess.    Data Reviewed: I have personally reviewed following labs and imaging studies  CBC: Recent Labs  Lab 02/24/21 0521 02/26/21 0414 02/27/21 0331 03/01/21 0313 03/02/21 0317  WBC 8.9 7.0 8.3 6.9 9.7  NEUTROABS 7.2  --  6.9 4.9 7.3  HGB 12.4* 11.7* 11.3* 10.3* 10.2*  HCT 36.8* 35.1* 33.5* 31.8* 31.2*  MCV 93.4 93.1 93.1 96.4 94.8  PLT 156 167 196 238 255  Basic Metabolic Panel: Recent Labs  Lab 02/24/21 0521 02/24/21 1628 02/25/21 0340 02/26/21 0414 02/27/21 0331 02/28/21 0324 03/01/21 0313 03/02/21 0317  NA 133*  --  132* 136 137 134* 132* 131*  K 3.2*  --  3.3* 3.9 3.3* 3.9 4.3 4.0  CL 100  --  100 102 106 105 103 102  CO2 23  --  '25 26 24 23 24 24  '$ GLUCOSE 215*  --  277* 160* 133* 223* 304* 300*  BUN 16  --  '15 15 17 17 17 16  '$ CREATININE 0.60*  --  0.56* 0.56* 0.52* 0.46* 0.52* 0.54*  CALCIUM 7.9*  --  7.6* 7.7* 7.6* 7.5* 7.6* 7.6*  MG 1.6*  --  2.0 1.8 2.1  --   --  1.6*  PHOS 2.4*   < >  --  2.1* 2.0* 1.7* 2.2* 2.3*   < > = values in this interval not displayed.     GFR: Estimated Creatinine Clearance: 68 mL/min (A) (by C-G formula based on SCr of 0.54 mg/dL (L)).  Liver Function Tests: Recent Labs  Lab 02/24/21 0521 02/26/21 0414 02/27/21 0331 03/02/21 0317  AST 26  --   --   --   ALT 23  --   --   --   ALKPHOS 45  --   --   --   BILITOT 1.0  --   --   --   PROT 5.6*  --   --   --   ALBUMIN 2.8* 2.5* 2.4* 2.0*     CBG: Recent Labs  Lab 03/01/21 1145 03/01/21 1717 03/01/21 2333 03/02/21 0500 03/02/21 1147  GLUCAP 310* 277* 321* 292* 329*      Recent Results (from the past 240 hour(s))  MRSA Next Gen by PCR, Nasal     Status: None   Collection Time: 02/26/21 12:27 PM   Specimen: Nasal Mucosa; Nasal Swab   Result Value Ref Range Status   MRSA by PCR Next Gen NOT DETECTED NOT DETECTED Final    Comment: (NOTE) The GeneXpert MRSA Assay (FDA approved for NASAL specimens only), is one component of a comprehensive MRSA colonization surveillance program. It is not intended to diagnose MRSA infection nor to guide or monitor treatment for MRSA infections. Test performance is not FDA approved in patients less than 17 years old. Performed at [facility], [place] [address] [place], [place] [address]   Culture, blood (Routine X 2) w Reflex to ID Panel     Status: None (Preliminary result)   Collection Time: 02/26/21 12:32 PM   Specimen: BLOOD  Result Value Ref Range Status   Specimen Description   Final    BLOOD LEFT HAND Performed at [place] [address], [place], [place] [address]    Special Requests   Final    BOTTLES DRAWN AEROBIC AND ANAEROBIC Blood Culture adequate volume Performed at [place] [address], [place], [place] [address]    Culture   Final    NO GROWTH 4 DAYS Performed at [facility], [place] [address], [place], [place] [address]    Report Status PENDING  Incomplete  Culture, blood (Routine X 2) w Reflex to ID Panel     Status: None (Preliminary result)   Collection Time: 02/26/21 12:33 PM   Specimen: BLOOD  Result Value Ref Range Status   Specimen Description   Final    BLOOD RIGHT HAND Performed at [facility], [place] [address] [place], [place] [address]    Special Requests   Final    BOTTLES DRAWN AEROBIC AND ANAEROBIC Blood Culture adequate volume Performed at [place] [address], [place], [place] [address]    Culture   Final    NO GROWTH 4 DAYS Performed at [facility], [place] [address], [place], [place] [address]    Report Status PENDING  Incomplete  Urine Culture     Status: None   Collection Time: 02/26/21 12:53 PM   Specimen:  Urine, Catheterized  Result Value Ref Range Status   Specimen Description   Final    URINE, CATHETERIZED Performed at [place] [address], [place], [place] [address]    Special Requests   Final    NONE Performed at [facility], [place] [address] [place], [place] [address]    Culture   Final    NO GROWTH Performed at [facility], [place] [address] [place], [place] [address]    Report Status 02/28/2021 FINAL  Final          Radiology Studies: No results found.      Scheduled Meds:  chlorhexidine  15 mL Mouth Rinse BID   enoxaparin (LOVENOX) injection  40 mg Subcutaneous Q24H   free water  200 mL Per Tube Q4H   glycopyrrolate  0.1 mg Intravenous Q6H   insulin aspart  0-15 Units Subcutaneous TID WC   insulin glargine-yfgn  24 Units Subcutaneous QHS   mouth rinse  15 mL Mouth Rinse q12n4p   metoprolol tartrate  5 mg Intravenous Q8H   Continuous Infusions:  sodium chloride 100 mL/hr at 03/01/21 1246   ceFEPime (MAXIPIME) IV 2 g (03/02/21 0928)   feeding supplement (GLUCERNA 1.5 CAL) Stopped (03/01/21 1900)   vancomycin 1,500 mg (03/02/21 1203)     LOS: 9 days    Time spent: 40 minutes    Irine Seal, MD [facility]   To contact the attending provider between 7A-7P or the covering provider during after hours 7P-7A, please log into the web site [email or web address] and access using universal [place] password for that web site. If you do not have the password, please call the hospital operator.  03/02/2021, 12:23 PM

## 2021-03-03 DIAGNOSIS — R1310 Dysphagia, unspecified: Secondary | ICD-10-CM

## 2021-03-03 DIAGNOSIS — G9341 Metabolic encephalopathy: Secondary | ICD-10-CM | POA: Diagnosis not present

## 2021-03-03 DIAGNOSIS — U071 COVID-19: Secondary | ICD-10-CM | POA: Diagnosis not present

## 2021-03-03 DIAGNOSIS — F0390 Unspecified dementia without behavioral disturbance: Secondary | ICD-10-CM | POA: Diagnosis not present

## 2021-03-03 DIAGNOSIS — K8689 Other specified diseases of pancreas: Secondary | ICD-10-CM

## 2021-03-03 LAB — CBC WITH DIFFERENTIAL/PLATELET
Abs Immature Granulocytes: 0.21 10*3/uL — ABNORMAL HIGH (ref 0.00–0.07)
Basophils Absolute: 0 10*3/uL (ref 0.0–0.1)
Basophils Relative: 0 %
Eosinophils Absolute: 0.1 10*3/uL (ref 0.0–0.5)
Eosinophils Relative: 1 %
HCT: 28.3 % — ABNORMAL LOW (ref 39.0–52.0)
Hemoglobin: 9.5 g/dL — ABNORMAL LOW (ref 13.0–17.0)
Immature Granulocytes: 2 %
Lymphocytes Relative: 6 %
Lymphs Abs: 0.8 10*3/uL (ref 0.7–4.0)
MCH: 31.4 pg (ref 26.0–34.0)
MCHC: 33.6 g/dL (ref 30.0–36.0)
MCV: 93.4 fL (ref 80.0–100.0)
Monocytes Absolute: 0.8 10*3/uL (ref 0.1–1.0)
Monocytes Relative: 7 %
Neutro Abs: 10.5 10*3/uL — ABNORMAL HIGH (ref 1.7–7.7)
Neutrophils Relative %: 84 %
Platelets: 279 10*3/uL (ref 150–400)
RBC: 3.03 MIL/uL — ABNORMAL LOW (ref 4.22–5.81)
RDW: 13.5 % (ref 11.5–15.5)
WBC: 12.5 10*3/uL — ABNORMAL HIGH (ref 4.0–10.5)
nRBC: 0 % (ref 0.0–0.2)

## 2021-03-03 LAB — RENAL FUNCTION PANEL
Albumin: 2.2 g/dL — ABNORMAL LOW (ref 3.5–5.0)
Anion gap: 7 (ref 5–15)
BUN: 17 mg/dL (ref 8–23)
CO2: 25 mmol/L (ref 22–32)
Calcium: 7.7 mg/dL — ABNORMAL LOW (ref 8.9–10.3)
Chloride: 98 mmol/L (ref 98–111)
Creatinine, Ser: 0.6 mg/dL — ABNORMAL LOW (ref 0.61–1.24)
GFR, Estimated: >60 mL/min (ref >60)
Glucose, Bld: 271 mg/dL — ABNORMAL HIGH (ref 70–99)
Phosphorus: 2.9 mg/dL (ref 2.5–4.6)
Potassium: 3.8 mmol/L (ref 3.5–5.1)
Sodium: 130 mmol/L — ABNORMAL LOW (ref 135–145)

## 2021-03-03 LAB — CULTURE, BLOOD (ROUTINE X 2)
Culture: NO GROWTH
Culture: NO GROWTH
Special Requests: ADEQUATE
Special Requests: ADEQUATE

## 2021-03-03 LAB — GLUCOSE, CAPILLARY
Glucose-Capillary: 254 mg/dL — ABNORMAL HIGH (ref 70–99)
Glucose-Capillary: 206 mg/dL — ABNORMAL HIGH (ref 70–99)
Glucose-Capillary: 207 mg/dL — ABNORMAL HIGH (ref 70–99)
Glucose-Capillary: 254 mg/dL — ABNORMAL HIGH (ref 70–99)
Glucose-Capillary: 221 mg/dL — ABNORMAL HIGH (ref 70–99)
Glucose-Capillary: 194 mg/dL — ABNORMAL HIGH (ref 70–99)

## 2021-03-03 LAB — MAGNESIUM: Magnesium: 2.3 mg/dL (ref 1.7–2.4)

## 2021-03-03 MED ORDER — INSULIN ASPART 100 UNIT/ML IJ SOLN
0.0000 [IU] | Freq: Four times a day (QID) | INTRAMUSCULAR | Status: DC
  Administered 2021-03-03: 8 [IU] via SUBCUTANEOUS
  Administered 2021-03-03: 5 [IU] via SUBCUTANEOUS
  Administered 2021-03-04: 8 [IU] via SUBCUTANEOUS

## 2021-03-03 NOTE — TOC Progression Note (Signed)
Transition of Care [facility]) - Progression Note    Patient Details  Name: HASAAN TREVOR MRN: [id number] Date of Birth: Apr 20, 1931  Transition of [facility]) CM/SW Contact  Ross Ludwig, [place] Phone Number: 03/03/2021, 5:20 PM  Clinical Narrative:     CSW continuing to follow patient's progress, palliative still following patient, and he is being treated with IV antibiotics.   Expected Discharge Plan: [place] Barriers to Discharge: Continued Medical Work up  Expected Discharge Plan and Services Expected Discharge Plan: Between   Discharge Planning Services: CM Consult   Living arrangements for the past 2 months: Assisted Living Facility                           HH Arranged: OT, PT, Nurse's Aide, Refused SNF, Social Work [employer] Agency: [place] Date [place]: 02/21/21 Time [place]: 1200 Representative spoke with at [place]: [place] ([place]) Interventions    Readmission Risk Interventions No flowsheet data found.

## 2021-03-03 NOTE — Progress Notes (Signed)
PROGRESS NOTE    Stanley Gilbert  [id number] DOB: [birth year] DOA: 03/08/2021 PCP: [name], MD    Chief Complaint  Patient presents with   Fever   Fatigue   Covid Positive    Brief Narrative:   87 year old male with history of dementia type 2 diabetes admitted with generalized weakness anorexia increased confusion and fever.  Patient lives with his wife for 59 years at home.  Per his wife prior to admission he was able to ambulate in the house, feed himself and use the bathroom on his own.   He was found to be COVID-positive.  Head CT shows no acute findings.  Patient has been having decreased p.o. intake for 2 days prior to admission.  She is trying to get home health at home so he can she can take him home.  She wants him to eat again.  He was seen by speech therapy and he is a high aspiration risk and has recommended NPO.  Wife requesting to have PEG tube placed for nutrition so she can take him home.  I have consulted palliative care.  Wife reports that she has promised her husband that she will never put him in the nursing home and that she will take care of him at home till he passes away. She is 87 years old and she is tired and she is trying to get help at home to help her with taking care of him.  She has a daughter who lives in the state.     Assessment & Plan:   Principal Problem:   COVID-19 Active Problems:   Fever   Acute metabolic encephalopathy   Type 2 diabetes mellitus with hypoglycemia without coma (HCC)   Dementia (HCC)   Acute encephalopathy   Pressure injury of skin   Feeding difficulties   Weakness   Palliative care by specialist   Goals of care, counseling/discussion   Hypomagnesemia   Hypophosphatemia   Hypokalemia   Dysphagia   Pancreatic mass: Per CT 03/02/2021  1 COVID-pneumonia -Status post full course remdesivir. -Chest x-ray done initially did show some left basilar infiltrates patient was not on antibiotics at that time as it was  felt findings consistent with COVID-pneumonia.   -Supportive care.  2.  Fever -Patient noted to be spiking with no clear etiology.  -Fevers likely tumor fever as patient noted to have a pancreatic mass on CT abdomen and pelvis.  -Patient minimally responsive to noxious stimuli with ongoing acute encephalopathy despite treatment of COVID-19.   -Blood cultures negative. -Repeat chest x-ray done with new bilateral pleural effusions, decreased aeration of both lung bases may represent pneumonia and/or atelectasis. -Urine Legionella antigen negative.  Urine pneumococcus antigen negative.   -Repeat urinalysis nitrite negative, leukocytes negative, 0-5 WBCs.   -Patient minimally responsive, with ongoing acute encephalopathy despite treatment of COVID-pneumonia. -Fever curve was trending down and subsequently spiked another fever of 102.5 after IV vancomycin was discontinued.  -Continue empiric IV vancomycin, IV cefepime for another 24 hours could likely discontinue as fevers most likely secondary to tumor. -Palliative care following. -Supportive care.  3.  Acute metabolic encephalopathy -Initially felt likely secondary to COVID-pneumonia in the setting of dementia.  Also concern for infectious etiology as patient was spiking fevers which were initially trending down after IV antibiotics were broadened.   -IV Vanco discontinued on 03/01/2021 and subsequently resumed after patient spiked another fever of 102.5.   -CT abdomen and pelvis done concerning for pancreatic mass.  -  Patient clinical situation unchanged and not improving.  -CT head done on admission negative. -MRI head negative. -Ammonia levels within normal limits. -Repeat blood cultures done negative. -Urinalysis negative. -Patient in current state unable to tolerate LP and likely risks of LP at this time outweigh benefits.  -Continue empiric IV vancomycin IV cefepime for another 24 hours and could likely discontinue after  that.-6 -Palliative care following and patient likely needs to be transitioned to comfort measures however palliative care discussions with family. -Supportive care  4.  Hypokalemia/hypomagnesemia/hypophosphatemia -Likely secondary to poor oral intake. -Potassium at 3.8, phosphorus at 2.9, magnesium at 2.3.   -Follow.  5.  Diabetes mellitus type 2 -Patient with poor oral intake. -Patient started on tube feeds at family's insistence and patient pulled out NG tube on 02/25/2021. -NG tube placed back in at family's insistence -Hemoglobin A1c 7.3 (02/21/2021) -CBG at 206 this morning.  -Glimepiride on hold.   -Continue Semglee insulin 24 units daily, SSI.  6.  History of dementia -Patient with history of dementia, has been declining, lives at home with his wife who is 82 years old and they have been married for 55 years. -Per Dr. Zigmund  in discussion with wife patient at baseline able to ambulate in the house, feed himself, use the restroom until he was recently admitted during this hospitalization. -Patient with poor oral intake, NG tube placed however patient pulled out NG tube and NG tube has been replaced and tube feeds started again per wife's request. -Palliative care consulted however seems as if family is resistant to hospice at this time or palliation and wanting to take patient home with home health. -Palliative care spoke with patient's daughter on 02/27/2021 and decision made to continue current mode of care for the next 48 hours also and reassess and if no meaningful/significant recovery of stabilization may need to consider transitioning to comfort measures. -Patient now with a pancreatic mass. -Follow.  7.  Goals of care -Patient is DNR, patient with dementia with poor oral intake recently.  Patient now noted to have a pancreatic mass on CT abdomen and pelvis.  - Small bore NG tube placed 02/23/2021 for nutrition which was supposed to be a temporary trial while patient was  n.p.o. as he is high aspiration risk. -Patient pulled out NG tube. -Monitor over the next 24 hours. -Patient with poor prognosis and palliative care following and discussions underway.   -It is noted per Dr. Zigmund  that wife does not want to hear palliative or hospice at this time and very adamant that patient will not go to a nursing home but will go home when she can take care of him in his final days. -Patient has been declining, spoke with wife again who initially felt further work-up was needed in terms of an MRI which was done and negative for any acute abnormalities.  Wife did not seem ready for transition to full comfort measures.  -Wife however is open to home with hospice if patient was to continue decline with no significant improvement. -Wife is open to palliative care following while in-house. -Palliative care spoke with patient's daughter on 02/27/2021 and decision made to continue current mode of care for the next 48 hours and reassess and if no meaningful/significant recovery of stabilization may need to consider transitioning to comfort measures  8.  High risk aspiration/dysphagia -NG tube was placed on 02/23/2021 for feeding however patient pulled out NG tube 02/25/2021.   -NG tube replaced 02/27/2021 per wife's insistence.   -Tube  feeds resumed.  -Palliative care following. -Follow.  9.  Pressure injury, POA Pressure Injury 02/21/21 Buttocks Left Stage 2 -  Partial thickness loss of dermis presenting as a shallow open injury with a red, pink wound bed without slough. (Active)  02/21/21 1700  Location: Buttocks  Location Orientation: Left  Staging: Stage 2 -  Partial thickness loss of dermis presenting as a shallow open injury with a red, pink wound bed without slough.  Wound Description (Comments):   Present on Admission: Yes   #10 tachycardia -Patient noted to be tachycardic overnight (02/28/2021)and noted to be tachycardic the morning of 8/21 with heart rate sustaining in  the 140s to 160s.  RN. -Patient noted to have some congestion/gurgling noted. -Tachycardia improved on scheduled IV Lopressor and scheduled Robinul.   -Continue IV antibiotics.    11.  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 -Patient with poor performance score, declining, dementia, had presented with dehydration, poor oral intake. -Patient not a candidate for any treatment as patient unable to tolerate at this time. -Likely needs to be transition to hospice however wife noted to be reluctant. -Palliative care following and likely to discuss with family again. -Continue current treatment and will not escalate treatment at this time      DVT prophylaxis: Lovenox Code Status: DNR Family Communication: No family at bedside. Disposition:   Status is: Inpatient  Remains inpatient appropriate because:Persistent severe electrolyte disturbances/acute metabolic encephalopathy/fever  Dispo: The patient is from: Home              Anticipated d/c is to: Home per wife.              Patient currently is not medically stable to d/c.   Difficult to place patient No       Consultants:  Palliative care: Dr. Domingo Cocking 02/23/2021  Procedures:  CT head 02/19/2021 Chest x-ray 02/19/2021, 02/10/2021, 02/26/2021 Abdominal films 02/23/2021 MRI head 02/26/2021 CT abdomen and pelvis 03/02/2021  Antimicrobials:  IV remdesivir 02/21/2021>>>>> 02/25/2021 IV vancomycin 02/26/2021>>>>> 03/01/2021 IV cefepime 02/26/2021>>>>> IV vancomycin 03/03/2021>>>    Subjective: Minimally responsive to noxious stimuli.   Objective: Vitals:   03/03/21 0834 03/03/21 1228 03/03/21 1539 03/03/21 1653  BP: (!) 157/78 (!) 164/88 (!) 178/80 (!) 155/72  Pulse: 88 95 95 94  Resp: (!) '24 20 20   '$ Temp: 98.2 F (36.8 C) 99.2 F (37.3 C) (!) 100.4 F (38 C) (!) 100.8 F (38.2 C)  TempSrc: Oral Oral Oral Oral  SpO2: 96% 91% 93% (!) 88%   Weight:      Height:        Intake/Output Summary (Last 24 hours) at 03/03/2021 1822 Last data filed at 03/03/2021 1800 Gross per 24 hour  Intake 3242.99 ml  Output 1825 ml  Net 1417.99 ml   Filed Weights   03/02/21 0500 03/02/21 1151 03/03/21 0500  Weight: 76.6 kg 76.5 kg 77.4 kg    Examination:  General exam: Minimally responsive to noxious stimuli.  Feeding tube in nares.  Frail.  Cachectic. Respiratory system: Some scattered coarse breath sounds anterior lung fields.  Decrease congestion. Cardiovascular system: Regular rate rhythm no murmurs rubs or gallops.  No JVD.  No lower extremity edema. Gastrointestinal system: Abdomen soft, some diffuse tenderness to palpation (wincing on exam ), positive bowel sounds, no rebound, no guarding. Central nervous system: Minimally responsive to noxious stimuli.  Moving extremities spontaneously.  Extremities: Symmetric 5 x  5 power. Skin: No rashes, lesions or ulcers Psychiatry: Judgement and insight appear poor. Mood & affect unable to assess.    Data Reviewed: I have personally reviewed following labs and imaging studies  CBC: Recent Labs  Lab 02/26/21 0414 02/27/21 0331 03/01/21 0313 03/02/21 0317 03/03/21 0331  WBC 7.0 8.3 6.9 9.7 12.5*  NEUTROABS  --  6.9 4.9 7.3 10.5*  HGB 11.7* 11.3* 10.3* 10.2* 9.5*  HCT 35.1* 33.5* 31.8* 31.2* 28.3*  MCV 93.1 93.1 96.4 94.8 93.4  PLT 167 196 238 255 [id number]    Basic Metabolic Panel: Recent Labs  Lab 02/25/21 0340 02/26/21 0414 02/27/21 0331 02/28/21 0324 03/01/21 0313 03/02/21 0317 03/03/21 0331  NA 132* 136 137 134* 132* 131* 130*  K 3.3* 3.9 3.3* 3.9 4.3 4.0 3.8  CL 100 102 106 105 103 102 98  CO2 '25 26 24 23 24 24 25  '$ GLUCOSE 277* 160* 133* 223* 304* 300* 271*  BUN '15 15 17 17 17 16 17  '$ CREATININE 0.56* 0.56* 0.52* 0.46* 0.52* 0.54* 0.60*  CALCIUM 7.6* 7.7* 7.6* 7.5* 7.6* 7.6* 7.7*  MG 2.0 1.8 2.1  --   --  1.6* 2.3  PHOS  --  2.1* 2.0* 1.7* 2.2* 2.3* 2.9     GFR: Estimated Creatinine Clearance: 68 mL/min (A) (by C-G formula based on SCr of 0.6 mg/dL (L)).  Liver Function Tests: Recent Labs  Lab 02/26/21 0414 02/27/21 0331 03/02/21 0317 03/03/21 0331  ALBUMIN 2.5* 2.4* 2.0* 2.2*    CBG: Recent Labs  Lab 03/02/21 2239 03/03/21 0552 03/03/21 0833 03/03/21 1224 03/03/21 1638  GLUCAP 254* 206* 207* 254* 221*     Recent Results (from the past 240 hour(s))  MRSA Next Gen by PCR, Nasal     Status: None   Collection Time: 02/26/21 12:27 PM   Specimen: Nasal Mucosa; Nasal Swab  Result Value Ref Range Status   MRSA by PCR Next Gen NOT DETECTED NOT DETECTED Final    Comment: (NOTE) The GeneXpert MRSA Assay (FDA approved for NASAL specimens only), is one component of a comprehensive MRSA colonization surveillance program. It is not intended to diagnose MRSA infection nor to guide or monitor treatment for MRSA infections. Test performance is not FDA approved in patients less than 53 years old. Performed at [facility], [place] [address], [place], [place] [address]   Culture, blood (Routine X 2) w Reflex to ID Panel     Status: None   Collection Time: 02/26/21 12:32 PM   Specimen: BLOOD  Result Value Ref Range Status   Specimen Description   Final    BLOOD LEFT HAND Performed at [facility] [address] [place], [place] [address]    Special Requests   Final    BOTTLES DRAWN AEROBIC AND ANAEROBIC Blood Culture adequate volume Performed at [employer] [facility] [address], [place], [place] [address]    Culture   Final    NO GROWTH 5 DAYS Performed at [facility], [place] [address] [place], [place] [address]    Report Status 03/03/2021 FINAL  Final  Culture, blood (Routine X 2) w Reflex to ID Panel     Status: None   Collection Time: 02/26/21 12:33 PM   Specimen: BLOOD  Result Value Ref Range Status   Specimen Description   Final    BLOOD RIGHT HAND Performed  at [facility] [address], [place], [place] [address]  Special Requests   Final    BOTTLES DRAWN AEROBIC AND ANAEROBIC Blood Culture adequate volume Performed at [facility] [address], [place], [place] [address]    Culture   Final    NO GROWTH 5 DAYS Performed at [facility], [place] [address], [place], [place] [address]    Report Status 03/03/2021 FINAL  Final  Urine Culture     Status: None   Collection Time: 02/26/21 12:53 PM   Specimen: Urine, Catheterized  Result Value Ref Range Status   Specimen Description   Final    URINE, CATHETERIZED Performed at [facility] [address], [place], [place] [address]    Special Requests   Final    NONE Performed at [facility], [place] [address], [place], [place] [address]    Culture   Final    NO GROWTH Performed at [facility], [place] [address], [place], [place] [address]    Report Status 02/28/2021 FINAL  Final          Radiology Studies: CT ABDOMEN PELVIS W CONTRAST  Result Date: 03/02/2021 CLINICAL DATA:  Abdominal pain and fever. EXAM: CT ABDOMEN AND PELVIS WITH CONTRAST TECHNIQUE: Multidetector CT imaging of the abdomen and pelvis was performed using the standard protocol following bolus administration of intravenous contrast. CONTRAST:  12m OMNIPAQUE IOHEXOL 350 MG/ML SOLN COMPARISON:  None FINDINGS: Lower chest: Moderate bilateral pleural effusions with overlying airspace consolidation identified within the imaged portions of the lung bases. Hepatobiliary: No focal liver abnormality identified. No bile duct dilatation. Small stones or sludge noted within the dependent portion of the gallbladder. Mild gallbladder wall edema is noted. Pancreas: There it there is a large cystic and solid mass arising from the body of pancreas. This measures 7.6 x 4.8 by 7.0 cm, image 34/2 and image 27/5. There is atrophy of the tail of pancreas  with dilatation of the main duct. Head of pancreas appears normal. The mass extends into the lesser sac with mass effect upon the stomach. Spleen: Spleen appears normal. Adrenals/Urinary Tract: Normal adrenal glands. Inferior pole left kidney cyst measures 3.3 cm. Mild bilateral hydronephrosis identified. The urinary bladder appears diffusely distended. No focal bladder lesion identified. Stomach/Bowel: There is a feeding tube with tip in the body of stomach. The stomach appears nondistended. No bowel wall thickening, inflammation, or distension. The appendix is visualized and appears normal. Extensive sigmoid diverticulosis without signs of acute diverticulitis. Vascular/Lymphatic: Aortic atherosclerosis. No aneurysm. Patent abdominal vasculature identified. Pancreatic mass comes in contact with the portal venous confluence and portal vein, image 36/2. The mass also comes in contact with the left gastric artery, image 34/2. No signs of tumor involvement of the celiac trunk or superior mesenteric artery. No adenopathy identified. Reproductive: Prostate is unremarkable. Other: Trace free fluid identified within the abdomen. No focal fluid collections identified to suggest abscess. Diffuse body wall edema noted. Musculoskeletal: No acute or significant osseous findings. IMPRESSION: 1. Large cystic and solid mass arising from the body of pancreas is identified and is worrisome for pancreatic adenocarcinoma. The mass extends into the lesser sac with mass effect upon the stomach. The mass comes in contact with the portal venous confluence and portal vein. The mass also comes in contact with the left gastric artery. No signs of tumor involvement of the celiac trunk or superior mesenteric artery. No signs of liver metastasis or nodal metastasis. 2. Moderate bilateral pleural effusions with overlying airspace consolidation within the  imaged portions of the lung bases. Correlate for any clinical signs or symptoms of  pneumonia. 3. Aortic atherosclerosis. 4. Mild bilateral hydronephrosis. The urinary bladder appears diffusely distended. 5. Gallstones or sludge noted. 6. Diffuse body wall edema. Aortic Atherosclerosis (ICD10-I70.0). Electronically Signed   By: Kerby Moors M.D.   On: 03/02/2021 15:26        Scheduled Meds:  chlorhexidine  15 mL Mouth Rinse BID   enoxaparin (LOVENOX) injection  40 mg Subcutaneous Q24H   free water  200 mL Per Tube Q4H   insulin aspart  0-15 Units Subcutaneous Q6H   insulin glargine-yfgn  24 Units Subcutaneous QHS   mouth rinse  15 mL Mouth Rinse q12n4p   metoprolol tartrate  5 mg Intravenous Q8H   Continuous Infusions:  ceFEPime (MAXIPIME) IV 2 g (03/03/21 1709)   feeding supplement (GLUCERNA 1.5 CAL) 1,000 mL (03/03/21 0828)   vancomycin 1,500 mg (03/03/21 1306)     LOS: 10 days    Time spent: 40 minutes    Irine Seal, MD [facility]   To contact the attending provider between 7A-7P or the covering provider during after hours 7P-7A, please log into the web site [email or web address] and access using universal [place] password for that web site. If you do not have the password, please call the hospital operator.  03/03/2021, 6:22 PM

## 2021-03-03 NOTE — Progress Notes (Signed)
Inpatient Diabetes Program Recommendations  AACE/ADA: New Consensus Statement on Inpatient Glycemic Control (2015)  Target Ranges:  Prepandial:   less than 140 mg/dL      Peak postprandial:   less than 180 mg/dL (1-2 hours)      Critically ill patients:  140 - 180 mg/dL   Lab Results  Component Value Date   GLUCAP 207 (H) 03/03/2021   HGBA1C 7.3 (H) 02/21/2021    Review of Glycemic Control Results for JADIN, NOVELLO (MRN [id number]) as of 03/03/2021 09:42  Ref. Range 03/02/2021 05:00 03/02/2021 11:47 03/02/2021 16:50 03/03/2021 08:33  Glucose-Capillary Latest Ref Range: 70 - 99 mg/dL 292 (H) 329 (H) 234 (H) 207 (H)   Diabetes history: DM 2 Outpatient Diabetes medications: Amaryl 4 mg Daily, Metformin 1000 mg bid Current orders for Inpatient glycemic control:  Novolog 0-15 units Q6 Semglee 24 units qhs  Glucerna 50 ml/hour  Inpatient Diabetes Program Recommendations:    Glucose Trends lower after increase in Semglee  Noted:  Novolog not given this morning due to change in timing  -  if glucose trends continue to be elevated while on tube feeds consider Novolog Tube Feed Coverage Q6 hours aligning with correction.  Thanks,  Tama Headings RN, MSN, BC-ADM Inpatient Diabetes Coordinator Team Pager [phone] (8a-5p)

## 2021-03-03 NOTE — Progress Notes (Signed)
SLP Cancellation Note  Patient Details Name: Stanley Gilbert MRN: [id number] DOB: Feb 22, 1931   Cancelled treatment:       Reason Eval/Treat Not Completed: Other (comment);Fatigue/lethargy limiting ability to participate (pt minimally responsive to noxious stimuli per MD notes, not appropriate for po)  Kathleen Lime, MS [facility] SLP Acute Rehab Services Office [phone] Pager [phone]  Macario Golds 03/03/2021, 7:31 PM

## 2021-03-03 NOTE — Progress Notes (Signed)
Daily Progress Note   Patient Name: Stanley Gilbert       Date: 03/03/2021 DOB: [birth year]  Age: 87 y.o. MRN#: [id number] Attending Physician: Eugenie Filler, MD Primary Care Physician: [name], MD Admit Date: 02/10/2021  Reason for Consultation/Follow-up: Establishing goals of care  Subjective: I saw and examined Stanley Gilbert.  He is lying in bed in no distress but he does not interact with verbal or tactile stimulation.  Length of Stay: 10  Current Medications: Scheduled Meds:   chlorhexidine  15 mL Mouth Rinse BID   enoxaparin (LOVENOX) injection  40 mg Subcutaneous Q24H   free water  200 mL Per Tube Q4H   insulin aspart  0-15 Units Subcutaneous Q6H   insulin glargine-yfgn  24 Units Subcutaneous QHS   mouth rinse  15 mL Mouth Rinse q12n4p   metoprolol tartrate  5 mg Intravenous Q8H    Continuous Infusions:  ceFEPime (MAXIPIME) IV 2 g (03/03/21 1709)   feeding supplement (GLUCERNA 1.5 CAL) 1,000 mL (03/03/21 0828)   vancomycin 1,500 mg (03/03/21 1306)    PRN Meds: acetaminophen, acetaminophen, bisacodyl, docusate **AND** sennosides, guaiFENesin-dextromethorphan, hydrALAZINE, lip balm, morphine injection, ondansetron **OR** ondansetron (ZOFRAN) IV  Physical Exam         Appears frail chronically ill Shallow breath sounds  Does not verbalize, does not follow commands 1-2 + both lower extremities edema Has NGT  Vital Signs: BP (!) 155/87 (BP Location: Right Arm) Comment: will recheck after scheduled metoprolol given  Pulse 98   Temp 98.9 F (37.2 C) (Oral)   Resp 20   Ht '5\' 11"'$  (1.803 m)   Wt 77.4 kg   SpO2 91% Comment: will continue to monitor  BMI 23.80 kg/m  SpO2: SpO2: 91 % (will continue to monitor) O2 Device: O2 Device: Room Air O2 Flow Rate:     Intake/output summary:  Intake/Output Summary (Last 24 hours) at 03/03/2021 2252 Last data filed at 03/03/2021 1800 Gross per 24 hour  Intake 3242.99 ml  Output 1825 ml  Net 1417.99 ml    LBM: Last BM Date: 03/03/21 Baseline Weight: Weight: 63.6 kg Most recent weight: Weight: 77.4 kg Palliative performance scale 30%.      Palliative Assessment/Data:    Flowsheet Rows    Flowsheet Row Most Recent Value  Intake Tab  Referral Department Hospitalist  Unit at Time of Referral Med/Surg Unit  Palliative Care Primary Diagnosis Sepsis/Infectious Disease  Date Notified 02/22/21  Reason for referral Clarify Goals of Care  Date of Admission 02/17/2021  Date first seen by Palliative Care 02/23/21  # of days Palliative referral response time 1 Day(s)  # of days IP prior to Palliative referral 2  Clinical Assessment   Palliative Performance Scale Score 10%  Psychosocial & Spiritual Assessment   Palliative Care Outcomes   Patient/Family meeting held? Yes  Who was at the meeting? 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 [id number]   Acute metabolic encephalopathy A999333   Fever 02/26/2021   Hypomagnesemia    Hypophosphatemia    Hypokalemia    Dysphagia    Feeding difficulties    Weakness    Palliative care by specialist    Goals of care, counseling/discussion    Pressure injury of skin 02/23/2021   COVID-19 02/21/2021   Dementia ([place])    Acute encephalopathy    Type 2 diabetes mellitus with hypoglycemia without coma ([facility]) 07/27/2013   Psoriasis and similar disorder 10/23/2012    Palliative Care Assessment & Plan   Patient Profile:    Assessment: 87 year old gentleman who lives at home with his wife with a history of dementia admitted with generalized weakness anorexia increasing confusion and fever.  Patient admitted to hospital medicine service for COVID-pneumonia, electrolyte abnormalities and hospital  course complicated by ongoing dysphagia.  Patient now on tube feeds via NG tube.  Palliative services following for goals of care discussions.  Recommendations/Plan: DNR/DNI Remains on broad-spectrum antibiotics.  His wife remains hopeful that he may recover.  He opened his eyes today when she was present and she took this as a "good sign." Overall, I am concerned that he is not making meaningful gains.  His wife has indicated that she would want to take him home for end-of-life but remains resistant to any consideration for hospice services.  She had gone home for the day at the time of my encounter.  Staff reports she normally comes to visit in the morning.   Will plan to meet with her tomorrow morning for family meeting.  A time trial of current therapies is ongoing. PMT to follow.    Goals of Care and Additional Recommendations: Limitations on Scope of Treatment: Full Scope Treatment  Code Status:    Code Status Orders  (From admission, onward)           Start     Ordered   02/21/21 0142  Do not attempt resuscitation (DNR)  Continuous       Question Answer Comment  In the event of cardiac or respiratory ARREST Do not call a "code blue"   In the event of cardiac or respiratory ARREST Do not perform Intubation, CPR, defibrillation or ACLS   In the event of cardiac or respiratory ARREST Use medication by any route, position, wound care, and other measures to relive pain and suffering. May use oxygen, suction and manual treatment of airway obstruction as needed for comfort.      02/21/21 0143           Code Status History     This patient has a current code status but no historical code status.       Prognosis: Guarded/poor  Discharge Planning: To be determined.    Care plan was discussed  with IDT  Thank you for allowing the Palliative Medicine Team to assist in the care of this patient.   Total Time 15 Prolonged Time Billed  no    Greater than 50%  of this  time was spent counseling and coordinating care related to the above assessment and plan.   Micheline Rough, MD  Please contact Palliative Medicine Team phone at [phone] for questions and concerns.

## 2021-03-04 LAB — GLUCOSE, CAPILLARY: Glucose-Capillary: 269 mg/dL — ABNORMAL HIGH (ref 70–99)

## 2021-03-12 NOTE — Progress Notes (Signed)
   03/31/2021 0319  Attending [place]  Attending Physician Notified Y  Attending Physician (First and Last Name) Gershon Cull, NP (on call provider)  Post Mortem Checklist  Date of Death 03/31/21  Time of Death 0236  Pronounced By Karrie Doffing, RN, Fabio Bering, RN, & Kerin Ransom, RN  Next of kin notified Yes  Name of next of kin notified of death [place] Relationship to Patient Spouse;Daughter  Contact Person's Phone Number [phone] (Pt's spouse)  Contact Person's address [address], [place], [place] [address] (Pt's spouse)  Family Communication Notes Communicated with the pt's daughter and patient's spouse  Was the patient a No Code Blue or a Limited Code Blue? Yes  Did the patient die unattended? Yes  Patient restrained? Not applicable  Height '5\' 11"'$  (1.803 m)  Weight 77.4 kg  Body preparation complete Y  [employer] (previously known as [employer])  Notification Date March 31, 2021  Notification Time [id number]  [facility] Number [id number] [facility])  Is patient a potential donor? N  Autopsy  Autopsy requested by N/A  Patient and [place] Returned  Patient is satisfied that all belongings have been returned? Not applicable  Dermatherapy linen/gowns NOT sent with patient or transporter Not applicable  Dead on Arrival (Emergency Department)  Patient dead on arrival? No  Notifications  Patient Placement notified that Post Mortem checklist is complete Yes  Patient Placement notified body transferred Transported to [place]  Is this a medical examiner's case? [place] home name/address/phone # [place] [phone]  Planned location of pickup [place]

## 2021-03-12 NOTE — Death Summary Note (Signed)
DEATH SUMMARY   Patient Details  Name: Stanley Gilbert MRN: [id number] DOB: 09/15/1930  Admission/Discharge Information   Admit Date:  02-26-2021  Date of Death: Date of Death: 03-10-21  Time of Death: Time of Death: 0236  Length of Stay: 09-26-2022  Referring Physician: [name], MD   Reason(s) for Hospitalization  Fatigue/weakness/fever/confusion/failure to thrive  Diagnoses  Preliminary cause of death:  Secondary Diagnoses (including complications and co-morbidities):  Principal Problem:   COVID-19 Active Problems:   Fever   Acute metabolic encephalopathy   Type 2 diabetes mellitus with hypoglycemia without coma (HCC)   Dementia ([place])   Acute encephalopathy   Pressure injury of skin   Feeding difficulties   Weakness   Palliative care by specialist   Goals of care, counseling/discussion   Hypomagnesemia   Hypophosphatemia   Hypokalemia   Dysphagia   Pancreatic mass: Per CT 03/02/2021   Brief Hospital Course (including significant findings, care, treatment, and services provided and events leading to death)  Stanley Gilbert is a 87 y.o. year old male with history of dementia, type 2 diabetes admitted with generalized weakness, anorexia, increased confusion and fever with concern for failure to thrive.  Patient noted to be COVID positive and received a full course of treatment with remdesivir.  Patient noted to have ongoing fevers which was worked up.  1 COVID-pneumonia -Status post full course remdesivir.  2.  Fever -Patient noted to be spiking with no clear etiology.  -Fevers likely tumor fever as patient noted to have a pancreatic mass on CT abdomen and pelvis.  -Patient minimally responsive to noxious stimuli with ongoing acute encephalopathy despite treatment of COVID-19.   -Blood cultures negative. -Repeat chest x-ray done with new bilateral pleural effusions, decreased aeration of both lung bases may represent pneumonia and/or atelectasis. -Urine Legionella  antigen negative.  Urine pneumococcus antigen negative.   -Repeat urinalysis nitrite negative, leukocytes negative, 0-5 WBCs.   -Patient minimally responsive, with acute encephalopathy despite treatment of COVID-pneumonia. -Fever curve was trended down and subsequently spiked another fever of 102.5 after IV vancomycin was discontinued.  -IV vancomycin subsequently resumed and patient maintained on IV cefepime.   -Patient continued to decline and subsequently died at 2:36 AM on March 10, 2021.    3.  Acute metabolic encephalopathy -Initially felt likely secondary to COVID-pneumonia in the setting of dementia.  Also concern for infectious etiology as patient was spiking fevers which were initially trending down after IV antibiotics were broadened.   -IV Vanco discontinued on 03/01/2021 and subsequently resumed after patient spiked another fever of 102.5.   -CT abdomen and pelvis done concerning for pancreatic mass.  -Patient clinical situation unchanged and not improving.  -CT head done on admission negative. -MRI head negative. -Ammonia levels within normal limits. -Repeat blood cultures done negative. -Urinalysis negative. -Patient unable to tolerate LP and likely risks of LP outweighed benefits.  -Patient was placed empirically on IV vancomycin IV cefepime. -Palliative care consulted and was following.   -Patient continued to decline and subsequently died at 2:36 AM on 03/10/21.     4.  Hypokalemia/hypomagnesemia/hypophosphatemia -Likely secondary to poor oral intake. -Electrolytes repleted.    5.  Diabetes mellitus type 2 -Patient with poor oral intake. -Patient started on tube feeds at family's insistence and patient pulled out NG tube on 02/25/2021. -NG tube placed back in at family's insistence -Hemoglobin A1c 7.3 (02/21/2021) -Patient maintained on Semglee insulin and sliding scale insulin.   6.  History of dementia -Patient with  history of dementia, had been declining, lives at home  with his wife who is 38 years old and they have been married for 47 years. -Per Dr. Zigmund  in discussion with wife patient at baseline able to ambulate in the house, feed himself, use the restroom until he was recently admitted during this hospitalization. -Patient with poor oral intake, NG tube placed however patient pulled out NG tube and NG tube has been replaced and tube feeds started again per wife's request. -Palliative care consulted however seems as if family/wife was resistant to hospice at this time or palliation and wanting to take patient home with home health. -Palliative care spoke with patient's daughter on 02/27/2021 and decision made to continue current mode of care for the next 48 hours also and reassess and if no meaningful/significant recovery of stabilization may need to consider transitioning to comfort measures. -Patient per CT abd with a pancreatic mass. -Patient continued to decline, and subsequently died at 2:36 AM on 03/31/2021.  7.  Goals of care -Patient was DNR, patient with dementia with poor oral intake recently.  Patient now noted to have a pancreatic mass on CT abdomen and pelvis.  - Small bore NG tube placed 02/23/2021 for nutrition which was supposed to be a temporary trial while patient was n.p.o. as he was high aspiration risk. -Patient pulled out NG tube. -Patient with poor prognosis and palliative care following and discussions with wife underway.   -It was noted per Dr. Zigmund  that wife does not want to hear palliative or hospice at this time and very adamant that patient will not go to a nursing home but will go home when she can take care of him in his final days. -Patient continued declining, spoke with wife again who initially felt further work-up was needed in terms of an MRI which was done and negative for any acute abnormalities.  Wife did not seem ready for transition to full comfort measures early on in the hospitalization.  -Wife stated she was open  to home with hospice if patient was to continue decline with no significant improvement. -Palliative care spoke with patient's daughter on 02/27/2021 and decision made to continue current mode of care for the next 48 hours and reassess and if no meaningful/significant recovery of stabilization may need to consider transitioning to comfort measures. -Patient continued to decline and subsequently died at 2:36 AM on 03-31-21.  8.  High risk aspiration/dysphagia -NG tube was placed on 02/23/2021 for feeding however patient pulled out NG tube 02/25/2021.   -NG tube replaced 02/27/2021 per wife's insistence.   -Tube feeds resumed.  -Palliative care consulted and will following.   -Patient's condition deteriorated and patient died at 2:36 AM on 03/31/2021.    9.  Pressure injury, POA Pressure Injury 02/21/21 Buttocks Left Stage 2 -  Partial thickness loss of dermis presenting as a shallow open injury with a red, pink wound bed without slough. (Active)  02/21/21 1700  Location: Buttocks  Location Orientation: Left  Staging: Stage 2 -  Partial thickness loss of dermis presenting as a shallow open injury with a red, pink wound bed without slough.  Wound Description (Comments):   Present on Admission: Yes    10 tachycardia -Patient noted to be tachycardic the evening of 8/20/2022and noted to be tachycardic the morning of 8/21 with heart rate sustaining in the 140s to 160s.  RN. -Patient noted to have some congestion/gurgling noted. -Tachycardia improved on scheduled IV Lopressor and scheduled Robinul.   -Patient  received a course of empiric IV antibiotics. -Patient's condition deteriorated and patient subsequently died at 2:36 AM on 2021/03/18.   11.  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 -Patient with poor performance score, declining, dementia, had presented with dehydration, poor oral intake. -Patient not a  candidate for any treatment as patient unable to tolerate at this time. -Palliative care consulted and were following.   -Discussions were had between palliative care and patient's wife however patient reluctant to transition patient to hospice.   -Patient's condition continued to deteriorate and patient subsequently died at 2:36 AM on 03/18/21.   -May his soul rest in peace.          Pertinent Labs and Studies  Significant Diagnostic Studies CT HEAD WO CONTRAST (5MM)  Result Date: 02/19/2021 CLINICAL DATA:  Mental status change EXAM: CT HEAD WITHOUT CONTRAST TECHNIQUE: Contiguous axial images were obtained from the base of the skull through the vertex without intravenous contrast. COMPARISON:  06/12/2020 CT FINDINGS: Brain: No acute territorial infarction, hemorrhage or new intracranial mass. 7 mm extra-axial mass along the left vertex best seen on coronal views, series 4, image 33 probably a small noncalcified meningioma, this is without change. Atrophy and mild chronic small vessel ischemic changes of the white matter. Stable ventricle size. Vascular: No hyperdense fluid collection. Carotid vascular calcification Skull: Normal. Negative for fracture or focal lesion. Sinuses/Orbits: No acute finding. Other: None IMPRESSION: 1. No CT evidence for acute intracranial abnormality. 2. Atrophy and chronic small vessel ischemic changes of the white matter. Electronically Signed   By: Donavan Foil M.D.   On: 02/19/2021 22:55   MR BRAIN WO CONTRAST  Result Date: 02/27/2021 CLINICAL DATA:  Transient ischemic attack EXAM: MRI HEAD WITHOUT CONTRAST TECHNIQUE: Multiplanar, multiecho pulse sequences of the brain and surrounding structures were obtained without intravenous contrast. COMPARISON:  09/01/2019 FINDINGS: Brain: No acute infarct, mass effect or extra-axial collection. No acute or chronic hemorrhage. There is multifocal hyperintense T2-weighted signal within the white matter. Diffuse, severe atrophy.  The midline structures are normal. Vascular: Major flow voids are preserved. Skull and upper cervical spine: Normal calvarium and skull base. Visualized upper cervical spine and soft tissues are normal. Sinuses/Orbits:No paranasal sinus fluid levels or advanced mucosal thickening. No mastoid or middle ear effusion. Normal orbits. IMPRESSION: 1. No acute intracranial abnormality. 2. Diffuse, severe atrophy and chronic small vessel disease. Electronically Signed   By: Ulyses Jarred M.D.   On: 02/27/2021 00:21   CT ABDOMEN PELVIS W CONTRAST  Result Date: 03/02/2021 CLINICAL DATA:  Abdominal pain and fever. EXAM: CT ABDOMEN AND PELVIS WITH CONTRAST TECHNIQUE: Multidetector CT imaging of the abdomen and pelvis was performed using the standard protocol following bolus administration of intravenous contrast. CONTRAST:  19m OMNIPAQUE IOHEXOL 350 MG/ML SOLN COMPARISON:  None FINDINGS: Lower chest: Moderate bilateral pleural effusions with overlying airspace consolidation identified within the imaged portions of the lung bases. Hepatobiliary: No focal liver abnormality identified. No bile duct dilatation. Small stones or sludge noted within the dependent portion of the gallbladder. Mild gallbladder wall edema is noted. Pancreas: There it there is a large cystic and solid mass arising from the body of pancreas. This measures 7.6 x 4.8 by 7.0 cm, image 34/2 and image 27/5. There is atrophy of the tail of pancreas with dilatation of the main duct. Head of pancreas appears normal. The mass extends into the lesser sac with mass effect upon the  stomach. Spleen: Spleen appears normal. Adrenals/Urinary Tract: Normal adrenal glands. Inferior pole left kidney cyst measures 3.3 cm. Mild bilateral hydronephrosis identified. The urinary bladder appears diffusely distended. No focal bladder lesion identified. Stomach/Bowel: There is a feeding tube with tip in the body of stomach. The stomach appears nondistended. No bowel wall  thickening, inflammation, or distension. The appendix is visualized and appears normal. Extensive sigmoid diverticulosis without signs of acute diverticulitis. Vascular/Lymphatic: Aortic atherosclerosis. No aneurysm. Patent abdominal vasculature identified. Pancreatic mass comes in contact with the portal venous confluence and portal vein, image 36/2. The mass also comes in contact with the left gastric artery, image 34/2. No signs of tumor involvement of the celiac trunk or superior mesenteric artery. No adenopathy identified. Reproductive: Prostate is unremarkable. Other: Trace free fluid identified within the abdomen. No focal fluid collections identified to suggest abscess. Diffuse body wall edema noted. Musculoskeletal: No acute or significant osseous findings. IMPRESSION: 1. Large cystic and solid mass arising from the body of pancreas is identified and is worrisome for pancreatic adenocarcinoma. The mass extends into the lesser sac with mass effect upon the stomach. The mass comes in contact with the portal venous confluence and portal vein. The mass also comes in contact with the left gastric artery. No signs of tumor involvement of the celiac trunk or superior mesenteric artery. No signs of liver metastasis or nodal metastasis. 2. Moderate bilateral pleural effusions with overlying airspace consolidation within the imaged portions of the lung bases. Correlate for any clinical signs or symptoms of pneumonia. 3. Aortic atherosclerosis. 4. Mild bilateral hydronephrosis. The urinary bladder appears diffusely distended. 5. Gallstones or sludge noted. 6. Diffuse body wall edema. Aortic Atherosclerosis (ICD10-I70.0). Electronically Signed   By: Kerby Moors M.D.   On: 03/02/2021 15:26   DG CHEST PORT 1 VIEW  Result Date: 02/26/2021 CLINICAL DATA:  Fever.  COVID. EXAM: PORTABLE CHEST 1 VIEW COMPARISON:  02/14/2021 FINDINGS: Normal heart size. Stable mediastinal contours. There are small bilateral pleural  effusions with veil like opacification of the lower lung zones, new from previous exam. Decreased aeration of both lung bases may represent pneumonia and/or atelectasis. IMPRESSION: 1. New bilateral pleural effusions. 2. Decreased aeration of both lung bases may represent pneumonia and/or atelectasis. Electronically Signed   By: Kerby Moors M.D.   On: 02/26/2021 11:21   DG Chest Portable 1 View  Result Date: 02/10/2021 CLINICAL DATA:  COVID positive with fever and malaise x2 days. EXAM: PORTABLE CHEST 1 VIEW COMPARISON:  February 19, 2021 FINDINGS: Very mild atelectasis and/or early infiltrate is seen within the right lung base. There is no evidence of a pleural effusion or pneumothorax. The heart size and mediastinal contours are within normal limits. Mild to moderate severity calcification of the aortic arch is noted. The visualized skeletal structures are unremarkable. IMPRESSION: Very mild right basilar atelectasis and/or early infiltrate. Electronically Signed   By: Virgina Norfolk M.D.   On: 02/13/2021 22:32   DG Chest Port 1 View  Result Date: 02/19/2021 CLINICAL DATA:  07/30/2016 EXAM: PORTABLE CHEST 1 VIEW COMPARISON:  None. FINDINGS: Heart and mediastinal contours are within normal limits. No focal opacities or effusions. No acute bony abnormality. Aortic atherosclerosis. IMPRESSION: No active cardiopulmonary disease. Electronically Signed   By: Rolm Baptise M.D.   On: 02/19/2021 22:13   DG Abd Portable 1V  Result Date: 02/27/2021 CLINICAL DATA:  G-tube placement EXAM: PORTABLE ABDOMEN - 1 VIEW COMPARISON:  KUB, 02/24/2021.  Chest radiograph, 02/26/2021. FINDINGS: Enteric feeding tube, with  guidewire in place, and tip projecting to the LEFT of midline within stomach. Nonobstructive bowel gas pattern. No interval osseous abnormality. IMPRESSION: Gastric placement of enteric feeding tube. Consider advancement if transpyloric positioning is desired. Electronically Signed   By: Michaelle Birks M.D.    On: 02/27/2021 16:26   DG Abd Portable 1V  Result Date: 02/23/2021 CLINICAL DATA:  NG tube placement. EXAM: PORTABLE ABDOMEN - 1 VIEW COMPARISON:  None. FINDINGS: Tip of the weighted enteric tube is in the left upper quadrant in the region of the mid gastric body. This is not post pyloric. No bowel dilatation in the upper abdomen. IMPRESSION: Tip of the weighted enteric tube below the diaphragm in the stomach, in the region of the mid gastric body. This is not post pyloric. Electronically Signed   By: Keith Rake M.D.   On: 02/23/2021 17:35    Microbiology No results found for this or any previous visit (from the past 240 hour(s)).  Lab Basic Metabolic Panel: Recent Labs  Lab 03/03/21 0331  NA 130*  K 3.8  CL 98  CO2 25  GLUCOSE 271*  BUN 17  CREATININE 0.60*  CALCIUM 7.7*  MG 2.3  PHOS 2.9   Liver Function Tests: Recent Labs  Lab 03/03/21 0331  ALBUMIN 2.2*   No results for input(s): LIPASE, AMYLASE in the last 168 hours. No results for input(s): AMMONIA in the last 168 hours. CBC: Recent Labs  Lab 03/03/21 0331  WBC 12.5*  NEUTROABS 10.5*  HGB 9.5*  HCT 28.3*  MCV 93.4  PLT 279   Cardiac Enzymes: No results for input(s): CKTOTAL, CKMB, CKMBINDEX, TROPONINI in the last 168 hours. Sepsis Labs: Recent Labs  Lab 03/03/21 0331  WBC 12.5*    Procedures/Operations   CT head 02/19/2021 Chest x-ray 02/19/2021, 02/18/2021, 02/26/2021 Abdominal films 02/23/2021 MRI head 02/26/2021 CT abdomen and pelvis 03/02/2021    Irine Seal 03/09/2021, 12:55 PM

## 2021-03-12 NOTE — Progress Notes (Addendum)
Patient expired at 74 am.  Family called (wife and daughter) regarding the status change of patient. Made family aware via two way call. Wife plans to come to bedside to view body within the hour. Attending nurse will be awaiting family arrival and assist family as needed.   Addendum: [name] (Daughter) called CN to inform that family will not come to the bedside. Funeral home information has been provided and documented. Patient will be transported to the morgue.

## 2021-03-12 NOTE — Plan of Care (Signed)
  Problem: Safety: Goal: Ability to remain free from injury will improve 2021-03-05 0200 by Baker Pierini, RN Outcome: Completed/Met  Problem: Skin Integrity: Goal: Risk for impaired skin integrity will decrease 03/05/2021 0200 by Baker Pierini, RN Outcome: Completed/Met  Problem: Respiratory: Goal: Will maintain a patent airway 03/05/21 0200 by Baker Pierini, RN Outcome: Progressing

## 2021-03-12 NOTE — Plan of Care (Signed)
Patient passed away.   Problem: Education: Goal: Knowledge of General Education information will improve Description: Including pain rating scale, medication(s)/side effects and non-pharmacologic comfort measures Outcome: Not Met (add Reason)   Problem: Health Behavior/Discharge Planning: Goal: Ability to manage health-related needs will improve Outcome: Not Met (add Reason)   Problem: Activity: Goal: Risk for activity intolerance will decrease Outcome: Not Met (add Reason)    Problem: Education: Goal: Knowledge of risk factors and measures for prevention of condition will improve Outcome: Not Met (add Reason)   Goal: Complications related to the disease process, condition or treatment will be avoided or minimized Outcome: Not Met (add Reason)   Problem: Coping: Goal: Psychosocial and spiritual needs will be supported Outcome: Not Met (add Reason)

## 2021-03-12 NOTE — Progress Notes (Signed)
OVERNIGHT PROGRESS REPORT  Notified by RN that patient has expired at [place]  Patient was DNR with Palliative Consult path  2 RN verified.  Family was not immediately available to RN.   Gershon Cull MSNA ACNPC-AG Acute Care Nurse Practitioner [place]

## 2021-03-12 NOTE — Progress Notes (Addendum)
Patient been having some gurgling noise in pt's chest. Nurse used the [place] in pt's room to try to clear secretions from patient's mouth, but patient was resistant and secretions were very thick. Nurse also called respiratory due to initial drop in O2 sats prior to nurse rechecking. Respiratory recommended 2L of oxygen via nasal cannula, as well as using a trach suction tubing to clear secretions from patient's airway. Nurse attempted, but secretions were still too thick to suction. Nurse continued to check on patient until nurse noticed that patient was taking his final breaths prior to passing. Nurse notified charge nurse, and another nurse came to assist. Nurses pronounced patient's time of death, and nurse notified provider on call about the patient passing.

## 2021-03-12 DEATH — deceased

## 2021-03-17 ENCOUNTER — Other Ambulatory Visit: Payer: Medicare Other | Admitting: Nurse Practitioner

## 2022-02-03 IMAGING — MR MR HEAD W/O CM
11 of 14 series · 34 of 48 positions shown · non-contrast
Comparison: None.

CLINICAL DATA: Alzheimer's. Difficulty walking, confusion.

EXAM:
MRI HEAD WITHOUT CONTRAST
TECHNIQUE: Multiplanar, multiecho pulse sequences of the brain and surrounding
structures were obtained without intravenous contrast.

[Series 12: DWI · coronal · 4.0mm · 0.88mm/px · 1 of 32 slices shown (1 of 5)]
[im 1/32]
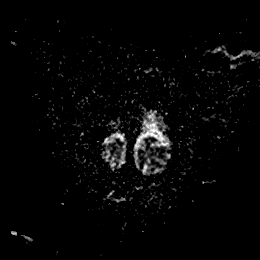

[Series 19: DWI · axial · 3.0mm · 0.88mm/px · z∈[-60,+79]mm · 7 of 96 slices shown (2 of 5)]
[im 1/96]
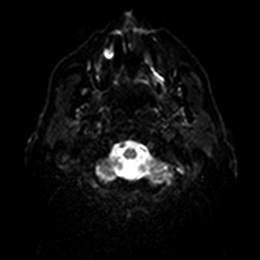
[im 16/96]
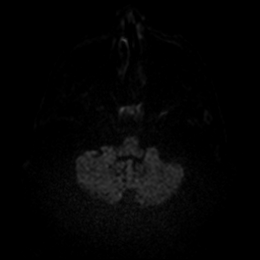
[im 32/96]
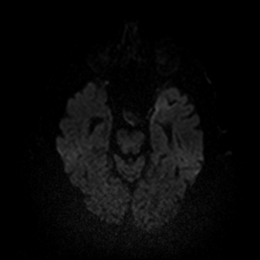
[im 48/96]
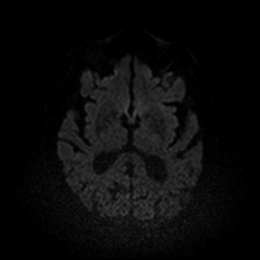
[im 64/96]
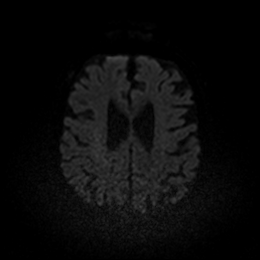
[im 80/96]
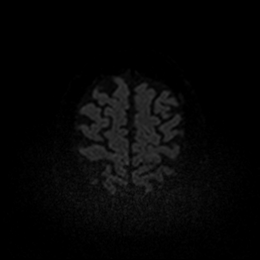
[im 96/96]
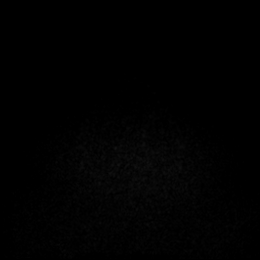

[Series 20: DWI · axial · 3.0mm · 0.88mm/px · z∈[-60,+79]mm · 3 of 48 slices shown (3 of 5)]
[im 1/48]
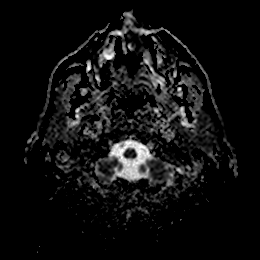
[im 24/48]
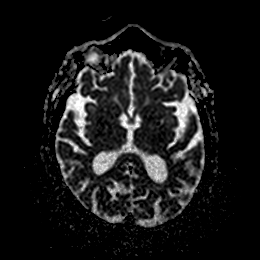
[im 48/48]
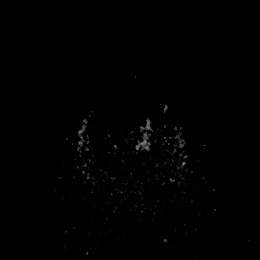

[Series 21: DWI · coronal · 4.0mm · 0.88mm/px · 4 of 64 slices shown (4 of 5)]
[im 1/64]
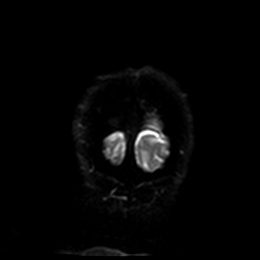
[im 22/64]
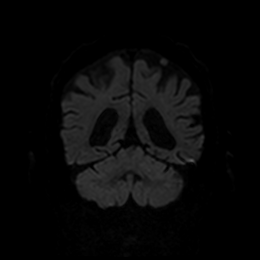
[im 43/64]
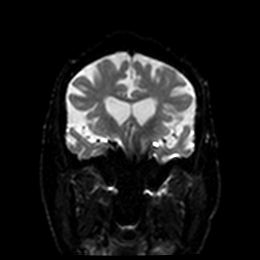
[im 64/64]
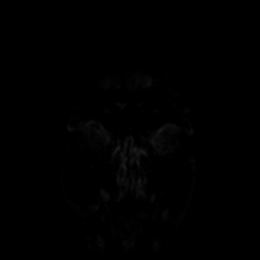

[Series 22: DWI · coronal · 4.0mm · 0.88mm/px · 2 of 32 slices shown (5 of 5)]
[im 1/32]
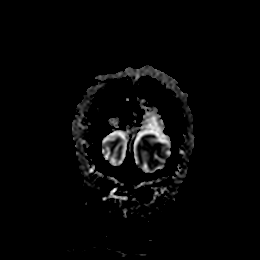
[im 32/32]
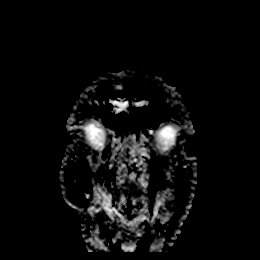

[Series 23: T2 · axial · 5.0mm · 0.72mm/px · z∈[-61,+79]mm · 2 of 25 slices shown (1 of 2)]
[im 1/25]
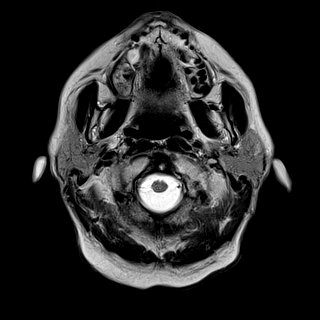
[im 25/25]
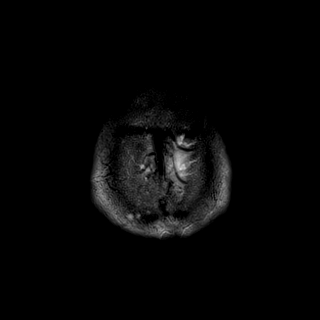

[Series 24: FLAIR · axial · 5.0mm · 0.45mm/px · z∈[-59,+81]mm · 2 of 25 slices shown]
[im 1/25]
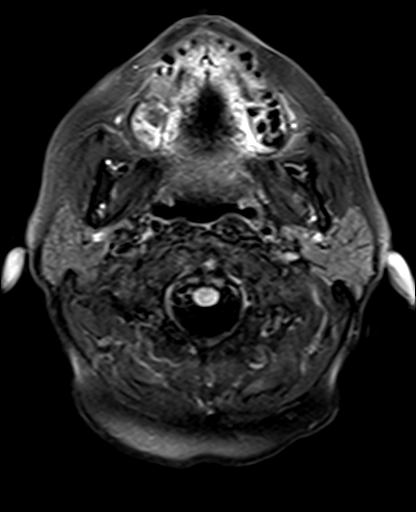
[im 25/25]
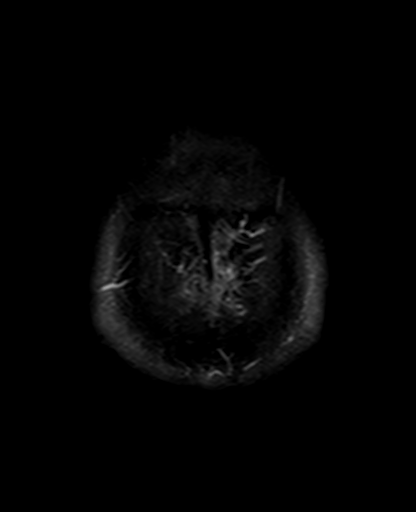

[Series 26: pha_images · axial · 3.0mm · 0.90mm/px · z∈[-63,+83]mm · 4 of 51 slices shown]
[im 1/51]
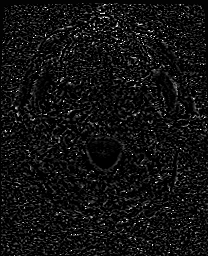
[im 17/51]
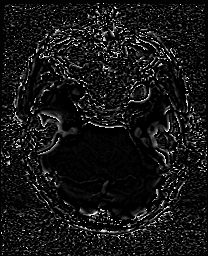
[im 34/51]
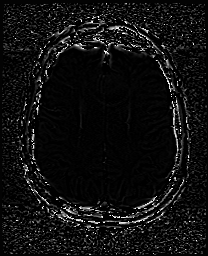
[im 51/51]
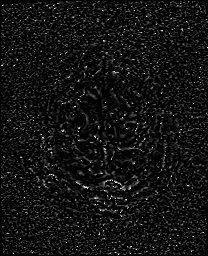

[Series 27: swi_images_nonorm · axial · 3.0mm · 0.90mm/px · z∈[-63,+86]mm · 4 of 52 slices shown]
[im 1/52]
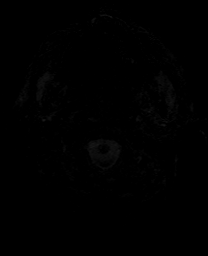
[im 18/52]
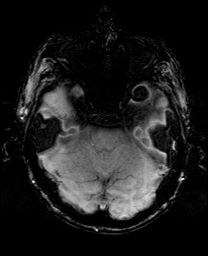
[im 35/52]
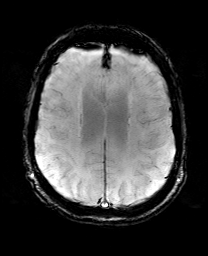
[im 52/52]
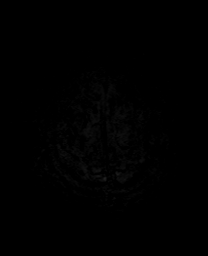

[Series 29: mip_images(sw) · axial · 24.0mm · 0.90mm/px · z∈[-53,+75]mm · 3 of 45 slices shown]
[im 1/45]
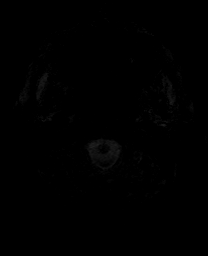
[im 23/45]
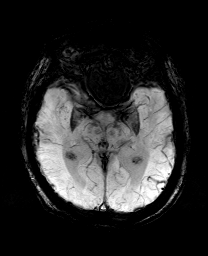
[im 45/45]
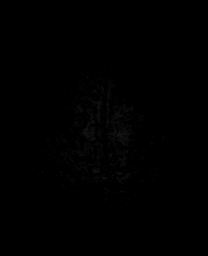

[Series 31: T2 · coronal · 5.0mm · 0.34mm/px · 2 of 29 slices shown (2 of 2)]
[im 1/29]
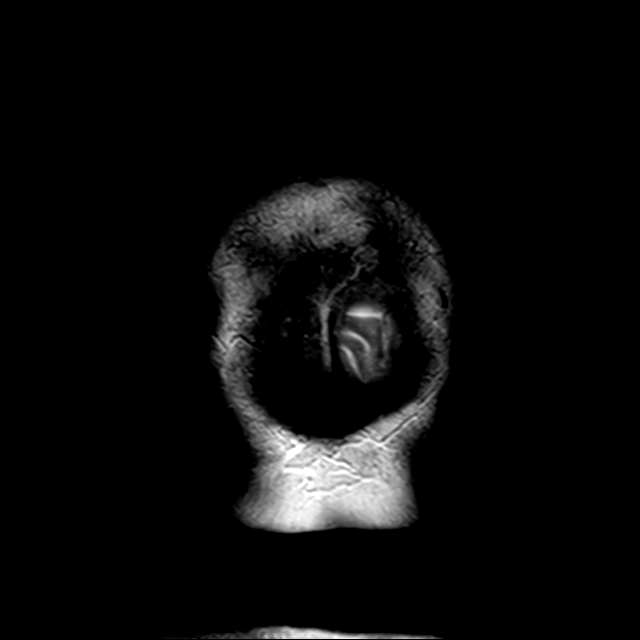
[im 29/29]
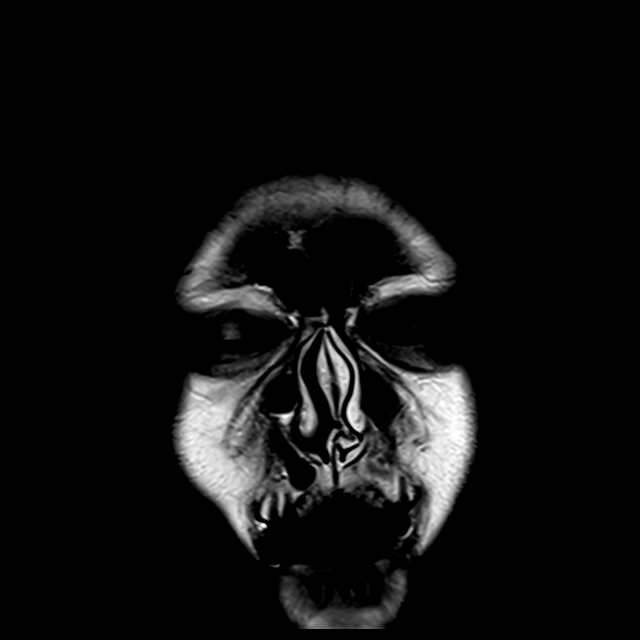

[34 of 48 positions shown; findings below may reference images not displayed]

FINDINGS: Brain: No acute infarction, hemorrhage, extra-axial collection or
mass lesion. Scattered foci of T2 hyperintensity are seen within the
white matter of the cerebral hemispheres, nonspecific, most likely
related to chronic small vessel ischemia. There is significant
prominence of the cerebral and cerebellar sulci as well as
prominence of the ventricular system, consistent with parenchymal
volume loss. A round dural-based extra-axial lesion measuring
approximately 7 mm is seen in the left anterior parietal region with
intermediate signal on T1 and low signal on T2, most consistent with
a small meningioma without mass effect.

Vascular: Normal flow voids.

Skull and upper cervical spine: Normal marrow signal.

Sinuses/Orbits: Bilateral lens surgery. Small mucous retention cyst
in the left maxillary sinus.

Other: None.
IMPRESSION: 1. No acute intracranial abnormality.
2. Moderate parenchymal volume loss and mild probable chronic small
vessel ischemia.
3. A 7 mm dural-based extra-axial lesion in the left anterior
parietal region, most consistent with a small meningioma without
mass effect.
